# Patient Record
Sex: Female | Born: 1945
Health system: Southern US, Community
[De-identification: ages and names within clinical notes are randomized; demographics above are authoritative.]

## PROBLEM LIST (undated history)

## (undated) DIAGNOSIS — C50912 Malignant neoplasm of unspecified site of left female breast: Secondary | ICD-10-CM

## (undated) DIAGNOSIS — C50919 Malignant neoplasm of unspecified site of unspecified female breast: Secondary | ICD-10-CM

## (undated) HISTORY — DX: Malignant neoplasm of unspecified site of left female breast: C50.912

## (undated) HISTORY — PX: TUBAL LIGATION: SHX77

## (undated) HISTORY — DX: Malignant neoplasm of unspecified site of unspecified female breast: C50.919

## (undated) HISTORY — PX: TONSILLECTOMY: SUR1361

---

## 2003-08-23 HISTORY — PX: MASTECTOMY, PARTIAL: SHX709

## 2003-08-23 HISTORY — PX: MASTECTOMY W/ NODES PARTIAL: SUR854

## 2003-08-26 ENCOUNTER — Encounter (INDEPENDENT_AMBULATORY_CARE_PROVIDER_SITE_OTHER): Payer: Self-pay | Admitting: Diagnostic Radiology

## 2003-08-26 ENCOUNTER — Encounter: Admission: RE | Admit: 2003-08-26 | Discharge: 2003-08-26 | Payer: Self-pay | Admitting: Family Medicine

## 2003-08-26 ENCOUNTER — Encounter (INDEPENDENT_AMBULATORY_CARE_PROVIDER_SITE_OTHER): Payer: Self-pay | Admitting: *Deleted

## 2003-09-21 ENCOUNTER — Inpatient Hospital Stay (HOSPITAL_COMMUNITY): Admission: RE | Admit: 2003-09-21 | Discharge: 2003-09-23 | Payer: Self-pay | Admitting: General Surgery

## 2003-10-25 ENCOUNTER — Encounter: Admission: RE | Admit: 2003-10-25 | Discharge: 2003-10-25 | Payer: Self-pay | Admitting: Oncology

## 2003-10-25 ENCOUNTER — Encounter (HOSPITAL_COMMUNITY): Admission: RE | Admit: 2003-10-25 | Discharge: 2003-11-24 | Payer: Self-pay | Admitting: Oncology

## 2003-10-26 ENCOUNTER — Ambulatory Visit (HOSPITAL_COMMUNITY): Admission: RE | Admit: 2003-10-26 | Discharge: 2003-10-26 | Payer: Self-pay | Admitting: Oncology

## 2003-11-11 ENCOUNTER — Ambulatory Visit (HOSPITAL_COMMUNITY): Admission: RE | Admit: 2003-11-11 | Discharge: 2003-11-11 | Payer: Self-pay | Admitting: Oncology

## 2003-11-25 ENCOUNTER — Encounter: Admission: RE | Admit: 2003-11-25 | Discharge: 2003-11-25 | Payer: Self-pay | Admitting: Oncology

## 2003-11-25 ENCOUNTER — Encounter (HOSPITAL_COMMUNITY): Admission: RE | Admit: 2003-11-25 | Discharge: 2003-12-25 | Payer: Self-pay | Admitting: Oncology

## 2003-12-29 ENCOUNTER — Encounter (HOSPITAL_COMMUNITY): Admission: RE | Admit: 2003-12-29 | Discharge: 2004-01-28 | Payer: Self-pay | Admitting: Oncology

## 2003-12-29 ENCOUNTER — Encounter: Admission: RE | Admit: 2003-12-29 | Discharge: 2003-12-29 | Payer: Self-pay | Admitting: Oncology

## 2004-01-04 ENCOUNTER — Ambulatory Visit: Admission: RE | Admit: 2004-01-04 | Discharge: 2004-04-03 | Payer: Self-pay | Admitting: Radiation Oncology

## 2004-04-04 ENCOUNTER — Encounter (HOSPITAL_COMMUNITY): Admission: RE | Admit: 2004-04-04 | Discharge: 2004-05-04 | Payer: Self-pay | Admitting: Oncology

## 2004-04-04 ENCOUNTER — Encounter: Admission: RE | Admit: 2004-04-04 | Discharge: 2004-04-04 | Payer: Self-pay | Admitting: Oncology

## 2004-07-19 ENCOUNTER — Encounter (HOSPITAL_COMMUNITY): Admission: RE | Admit: 2004-07-19 | Discharge: 2004-07-21 | Payer: Self-pay | Admitting: Oncology

## 2004-07-19 ENCOUNTER — Encounter: Admission: RE | Admit: 2004-07-19 | Discharge: 2004-07-21 | Payer: Self-pay | Admitting: Oncology

## 2005-01-16 ENCOUNTER — Ambulatory Visit (HOSPITAL_COMMUNITY): Payer: Self-pay | Admitting: Oncology

## 2005-01-16 ENCOUNTER — Encounter: Admission: RE | Admit: 2005-01-16 | Discharge: 2005-01-16 | Payer: Self-pay | Admitting: Oncology

## 2005-01-16 ENCOUNTER — Encounter (HOSPITAL_COMMUNITY): Admission: RE | Admit: 2005-01-16 | Discharge: 2005-02-15 | Payer: Self-pay | Admitting: Oncology

## 2005-03-07 ENCOUNTER — Encounter (HOSPITAL_COMMUNITY): Admission: RE | Admit: 2005-03-07 | Discharge: 2005-04-06 | Payer: Self-pay | Admitting: Oncology

## 2005-03-07 ENCOUNTER — Encounter: Admission: RE | Admit: 2005-03-07 | Discharge: 2005-03-07 | Payer: Self-pay | Admitting: Oncology

## 2005-07-16 ENCOUNTER — Encounter (HOSPITAL_COMMUNITY): Admission: RE | Admit: 2005-07-16 | Discharge: 2005-07-21 | Payer: Self-pay | Admitting: Oncology

## 2005-07-16 ENCOUNTER — Encounter: Admission: RE | Admit: 2005-07-16 | Discharge: 2005-07-21 | Payer: Self-pay | Admitting: Oncology

## 2005-07-16 ENCOUNTER — Ambulatory Visit (HOSPITAL_COMMUNITY): Payer: Self-pay | Admitting: Oncology

## 2005-10-09 ENCOUNTER — Encounter: Admission: RE | Admit: 2005-10-09 | Discharge: 2005-10-09 | Payer: Self-pay | Admitting: Oncology

## 2005-10-09 ENCOUNTER — Ambulatory Visit (HOSPITAL_COMMUNITY): Payer: Self-pay | Admitting: Oncology

## 2006-04-30 ENCOUNTER — Ambulatory Visit (HOSPITAL_COMMUNITY): Payer: Self-pay | Admitting: Oncology

## 2006-04-30 ENCOUNTER — Encounter: Admission: RE | Admit: 2006-04-30 | Discharge: 2006-04-30 | Payer: Self-pay | Admitting: Oncology

## 2006-04-30 ENCOUNTER — Encounter (HOSPITAL_COMMUNITY): Admission: RE | Admit: 2006-04-30 | Discharge: 2006-05-30 | Payer: Self-pay | Admitting: Oncology

## 2006-07-31 ENCOUNTER — Encounter (HOSPITAL_COMMUNITY): Admission: RE | Admit: 2006-07-31 | Discharge: 2006-08-30 | Payer: Self-pay | Admitting: Oncology

## 2006-07-31 ENCOUNTER — Encounter: Admission: RE | Admit: 2006-07-31 | Discharge: 2006-07-31 | Payer: Self-pay | Admitting: Oncology

## 2006-07-31 ENCOUNTER — Ambulatory Visit (HOSPITAL_COMMUNITY): Payer: Self-pay | Admitting: Oncology

## 2007-01-07 ENCOUNTER — Ambulatory Visit (HOSPITAL_COMMUNITY): Payer: Self-pay | Admitting: Oncology

## 2007-01-07 ENCOUNTER — Encounter (HOSPITAL_COMMUNITY): Admission: RE | Admit: 2007-01-07 | Discharge: 2007-02-06 | Payer: Self-pay | Admitting: Oncology

## 2007-05-29 ENCOUNTER — Ambulatory Visit: Payer: Self-pay | Admitting: Internal Medicine

## 2007-06-19 ENCOUNTER — Ambulatory Visit (HOSPITAL_COMMUNITY): Admission: RE | Admit: 2007-06-19 | Discharge: 2007-06-19 | Payer: Self-pay | Admitting: Internal Medicine

## 2007-06-19 ENCOUNTER — Encounter: Payer: Self-pay | Admitting: Internal Medicine

## 2007-06-19 ENCOUNTER — Ambulatory Visit: Payer: Self-pay | Admitting: Internal Medicine

## 2007-07-21 ENCOUNTER — Ambulatory Visit (HOSPITAL_COMMUNITY): Payer: Self-pay | Admitting: Oncology

## 2007-07-23 ENCOUNTER — Ambulatory Visit (HOSPITAL_COMMUNITY): Admission: RE | Admit: 2007-07-23 | Discharge: 2007-07-23 | Payer: Self-pay | Admitting: Family Medicine

## 2007-07-30 ENCOUNTER — Ambulatory Visit (HOSPITAL_COMMUNITY): Admission: RE | Admit: 2007-07-30 | Discharge: 2007-07-30 | Payer: Self-pay | Admitting: Family Medicine

## 2008-02-06 ENCOUNTER — Encounter (HOSPITAL_COMMUNITY): Admission: RE | Admit: 2008-02-06 | Discharge: 2008-03-07 | Payer: Self-pay | Admitting: Oncology

## 2008-02-06 ENCOUNTER — Ambulatory Visit (HOSPITAL_COMMUNITY): Payer: Self-pay | Admitting: Oncology

## 2008-09-30 ENCOUNTER — Ambulatory Visit (HOSPITAL_COMMUNITY): Admission: RE | Admit: 2008-09-30 | Discharge: 2008-09-30 | Payer: Self-pay | Admitting: Family Medicine

## 2009-03-07 ENCOUNTER — Encounter (HOSPITAL_COMMUNITY): Admission: RE | Admit: 2009-03-07 | Discharge: 2009-04-06 | Payer: Self-pay | Admitting: Oncology

## 2009-03-07 ENCOUNTER — Ambulatory Visit (HOSPITAL_COMMUNITY): Payer: Self-pay | Admitting: Oncology

## 2010-03-22 ENCOUNTER — Ambulatory Visit (HOSPITAL_COMMUNITY): Admission: RE | Admit: 2010-03-22 | Discharge: 2010-03-22 | Payer: Self-pay | Admitting: Oncology

## 2010-06-13 ENCOUNTER — Encounter (HOSPITAL_COMMUNITY): Admission: RE | Admit: 2010-06-13 | Discharge: 2010-07-13 | Payer: Self-pay | Admitting: Oncology

## 2010-06-13 ENCOUNTER — Ambulatory Visit (HOSPITAL_COMMUNITY): Payer: Self-pay | Admitting: Oncology

## 2010-10-17 ENCOUNTER — Encounter (INDEPENDENT_AMBULATORY_CARE_PROVIDER_SITE_OTHER): Payer: Self-pay | Admitting: *Deleted

## 2010-11-12 ENCOUNTER — Encounter (HOSPITAL_COMMUNITY): Payer: Self-pay | Admitting: Oncology

## 2010-11-12 ENCOUNTER — Encounter: Payer: Self-pay | Admitting: Family Medicine

## 2010-11-23 NOTE — Letter (Signed)
Summary: Out of Work Note  [facility]  [address]   [place], [place] [address]   Phone: [phone]  Fax: [phone]    10/17/2010  TO: Katherine Mcmillan IT MAY CONCERN  RE: Absence from work during husband's medical procedure  Katherine Mcmillan [place] [address] [place] 1946/02/25       The above named individual's husband was currently under my care and she would have needed to be out of work to perform the duties of a caregiver during the dates below.    FROM:  09/24/2010   THROUGH: 09/26/10    REASON:  Due to medical procedure of husband who needed 24 hour care for gastrointestinal procedure.    MAY RETURN ON:  09/27/10     If you have any further questions or need additional information, please call.     Sincerely,      R. Roetta Sessions, M.D.   [name], Clinic Site Manager [facility]  Phone: [phone]    Fax: [phone]

## 2011-01-04 LAB — COMPREHENSIVE METABOLIC PANEL
ALT: 17 U/L (ref 0–35)
AST: 21 U/L (ref 0–37)
Albumin: 4.2 g/dL (ref 3.5–5.2)
Alkaline Phosphatase: 49 U/L (ref 39–117)
BUN: 12 mg/dL (ref 6–23)
CO2: 29 mEq/L (ref 19–32)
Calcium: 9 mg/dL (ref 8.4–10.5)
Chloride: 103 mEq/L (ref 96–112)
Creatinine, Ser: 0.77 mg/dL (ref 0.4–1.2)
GFR calc Af Amer: >60 mL/min (ref >60)
GFR calc non Af Amer: >60 mL/min (ref >60)
Glucose, Bld: 84 mg/dL (ref 70–99)
Potassium: 3.7 mEq/L (ref 3.5–5.1)
Sodium: 137 mEq/L (ref 135–145)
Total Bilirubin: 0.6 mg/dL (ref 0.3–1.2)
Total Protein: 7.3 g/dL (ref 6.0–8.3)

## 2011-01-04 LAB — CBC
HCT: 38.9 % (ref 36.0–46.0)
Hemoglobin: 13.2 g/dL (ref 12.0–15.0)
MCH: 29.9 pg (ref 26.0–34.0)
MCHC: 33.9 g/dL (ref 30.0–36.0)
MCV: 88.2 fL (ref 78.0–100.0)
Platelets: 204 10*3/uL (ref 150–400)
RBC: 4.41 MIL/uL (ref 3.87–5.11)
RDW: 14.1 % (ref 11.5–15.5)
WBC: 7.6 10*3/uL (ref 4.0–10.5)

## 2011-01-04 LAB — DIFFERENTIAL
Basophils Absolute: 0 10*3/uL (ref 0.0–0.1)
Basophils Relative: 1 % (ref 0–1)
Eosinophils Absolute: 0.1 10*3/uL (ref 0.0–0.7)
Eosinophils Relative: 2 % (ref 0–5)
Lymphocytes Relative: 36 % (ref 12–46)
Lymphs Abs: 2.7 10*3/uL (ref 0.7–4.0)
Monocytes Absolute: 0.7 10*3/uL (ref 0.1–1.0)
Monocytes Relative: 9 % (ref 3–12)
Neutro Abs: 4 10*3/uL (ref 1.7–7.7)
Neutrophils Relative %: 52 % (ref 43–77)

## 2011-01-30 LAB — COMPREHENSIVE METABOLIC PANEL
ALT: 18 U/L (ref 0–35)
AST: 21 U/L (ref 0–37)
Albumin: 3.9 g/dL (ref 3.5–5.2)
Alkaline Phosphatase: 57 U/L (ref 39–117)
BUN: 10 mg/dL (ref 6–23)
CO2: 29 mEq/L (ref 19–32)
Calcium: 8.8 mg/dL (ref 8.4–10.5)
Chloride: 103 mEq/L (ref 96–112)
Creatinine, Ser: 0.71 mg/dL (ref 0.4–1.2)
GFR calc Af Amer: >60 mL/min (ref >60)
GFR calc non Af Amer: >60 mL/min (ref >60)
Glucose, Bld: 88 mg/dL (ref 70–99)
Potassium: 3.9 mEq/L (ref 3.5–5.1)
Sodium: 138 mEq/L (ref 135–145)
Total Bilirubin: 0.9 mg/dL (ref 0.3–1.2)
Total Protein: 7 g/dL (ref 6.0–8.3)

## 2011-01-30 LAB — DIFFERENTIAL
Eosinophils Absolute: 0.1 10*3/uL (ref 0.0–0.7)
Eosinophils Relative: 2 % (ref 0–5)
Lymphocytes Relative: 38 % (ref 12–46)
Lymphs Abs: 1.9 10*3/uL (ref 0.7–4.0)
Monocytes Relative: 11 % (ref 3–12)

## 2011-01-30 LAB — CBC
HCT: 39.9 % (ref 36.0–46.0)
Hemoglobin: 13.7 g/dL (ref 12.0–15.0)
MCHC: 34.5 g/dL (ref 30.0–36.0)
MCV: 90.1 fL (ref 78.0–100.0)
Platelets: 197 10*3/uL (ref 150–400)
RBC: 4.43 MIL/uL (ref 3.87–5.11)
RDW: 13.7 % (ref 11.5–15.5)
WBC: 5.2 10*3/uL (ref 4.0–10.5)

## 2011-03-06 NOTE — Op Note (Signed)
Katherine Mcmillan, Katherine Mcmillan                  ACCOUNT NO.:  [id number]   MEDICAL RECORD NO.:  [id number]          PATIENT TYPE:  AMB   LOCATION:  DAY                           FACILITY:  [facility]   PHYSICIAN:  R. Roetta Sessions, M.D. DATE OF BIRTH:  April 24, 1946   DATE OF PROCEDURE:  06/19/2007  DATE OF DISCHARGE:                               OPERATIVE REPORT   PROCEDURE:  Colonoscopy with biopsy.   INDICATIONS FOR PROCEDURE:  A 65 year old lady sent over out of the  courtesy Dr. Patrica Duel.  She has history of gastroesophageal reflux  disease.  Symptoms well-controlled on omeprazole.  We note she has never  had her lower GI tract imaged and have offered her screening  colonoscopy.  She has no lower GI site tract symptoms and no family  history colorectal neoplasia.  Colonoscopy is now being done.  This  approach has been discussed patient at length.  Potential risks,  benefits, alternatives have been reviewed, questions answered.  She is  agreeable.  Please see documentation medical record.   PROCEDURE NOTE:  O2 saturation, blood pressure, pulse, respirations  monitored throughout entire procedure.   CONSCIOUS SEDATION:  Versed 3 mg IV, Demerol 75 mg IV divided doses.   INSTRUMENT:  Pentax video chip system.   FINDINGS:  Digital rectal exam revealed no abnormalities.   ENDOSCOPIC FINDINGS:  The prep was good.  Colon:  Colonic mucosa was surveyed from the rectosigmoid junction  through the left transverse right colon to area appendiceal orifice,  ileocecal valve and cecum.  These structures well seen photographed for  the record.  From this level scope slowly withdrawn.  All previous  mentioned mucosal surfaces were again seen.  The patient was noted have  a single 4 mm polyp base cecum, cold biopsy/removed.  The patient had  extensive left-sided diverticula, remaining colonic mucosa appeared  normal.  The scope was pulled down the rectum.  Thorough examination  rectal mucosa including  retroflexed view anal verge demonstrated no  abnormalities.  The patient tolerated the procedure well, was reacted in  endoscopy.   IMPRESSION:  Normal rectum, left-sided diverticula, diminutive cecal  polyp status post cold biopsy removal. Remainder colon mucosa appeared  normal.   RECOMMENDATIONS:  1. Diverticulosis literature provided [name].  2. Follow-up on path.  3. Further recommendations to follow.      Jonathon Bellows, M.D.  Electronically Signed     RMR/MEDQ  D:  06/19/2007  T:  06/20/2007  Job:  [id number]   cc:   Corrie Mckusick, M.D.  Fax: [phone]

## 2011-03-06 NOTE — Consult Note (Signed)
NAMEELLINGTON, Katherine Mcmillan                  ACCOUNT NO.:  [id number]   MEDICAL RECORD NO.:  [id number]          PATIENT TYPE:  AMB   LOCATION:  DAY                           FACILITY:  [facility]   PHYSICIAN:  R. Roetta Sessions, M.D. DATE OF BIRTH:  1946/01/19   DATE OF CONSULTATION:  DATE OF DISCHARGE:                                 CONSULTATION   CHIEF COMPLAINT:  GERD/in need of screening colonoscopy.   HISTORY OF PRESENT ILLNESS:  The patient is a 65 year old female who has  never had a screening colonoscopy. She developed severe epigastric and  left upper quadrant pain about 2 weeks ago. She describes heartburn and  indigestion along with some nausea. She was started on Omeprazole 20 mg  b.i.d. by Melony Overly, PA-C. She tells me her symptoms are 100%  relieved at this time. She denies any vomiting, denies any dysphagia,  odynophagia. Denies any anorexia or early satiety. Denies any water  brash. She is generally having 2-3 bowel movements a day. This is her  baseline. She denies any rectal bleeding or melena. Denies any  constipation or diarrhea. Her weight is steadily decreasing due to diet.  She has never had a screening colonoscopy.   PAST MEDICAL AND SURGICAL HISTORY:  She was diagnosed with breast cancer  November of 2004. She is status post lumpectomy, chemotherapy and  radiation. She is status post tubal ligation, status post tonsillectomy.  She is status post H. pylori treatment approximately 10 years ago. She  has history of intermittent GERD over the years.   CURRENT MEDICATIONS:  1. Arimidex once daily.  2. Omeprazole 20 mg b.i.d.  3. Multivitamin daily.   ALLERGIES:  NO KNOWN DRUG ALLERGIES.   FAMILY HISTORY:  There is no known family history of colorectal  carcinoma, carcinoma of the liver, chronic GI problems.   SOCIAL HISTORY:  The patient is married. She has 4 grown healthy  children. She is employed with [employer] full time. She denies any tobacco  or alcohol or drug  use.   REVIEW OF SYSTEMS:  See HPI; otherwise negative.   PHYSICAL EXAMINATION:  VITAL SIGNS: Weight 206 pounds, height 68 inches,  temp 98 degrees, blood pressure 130/74 and pulse 64.  GENERAL: The patient is a well-developed, well-nourished African-  American female in no acute distress.  HEENT: Pupils equal. Conjunctivae not injected. Oropharynx pink and  moist without oral lesions.  NECK: The neck is supple without mass or thyromegaly.  CHEST: Regular rate and rhythm. Normal S1, S2. No murmurs, rubs or  gallops.  LUNGS: Clear to auscultation bilaterally.  ABDOMEN: Positive bowel sounds x4. No bruits auscultated. Soft,  nontender, nondistended without palpable masses or hepatosplenomegaly.  No rebound or guarding.  EXTREMITIES: Without clubbing or edema bilaterally.  SKIN: Warm and dry without any rash or jaundice.   IMPRESSION:  The patient is a 65 year old female with heartburn and  indigestion; now well controlled on b.i.d. proton pump inhibitor. I have  asked her to decrease her omeprazole to once daily to see how she does.  If she does well no further  intervention is necessary at this point.   She is in need of screening colonoscopy at this time.   PLAN:  1. Decrease omeprazole to 20 mg daily. Call if she has any problems.  2. GERD literature given for her review.  3. Screening colonoscopy with Dr. Jena Gauss in the near future. I have      discussed the procedure including the risks and benefits to      include, but are not limited to: Bleeding, infection, perforation,      drug reaction. She agreed with the plan. Exam will be obtained.   We would like to thank Dr. Nobie Putnam for allowing [place] to participate in  the care of this patient.      Lorenza Burton, N.P.      Jonathon Bellows, M.D.  Electronically Signed    KJ/MEDQ  D:  05/29/2007  T:  05/29/2007  Job:  [id number]   cc:   Patrica Duel, M.D.  Fax: [phone]

## 2011-03-09 NOTE — H&P (Signed)
Katherine Mcmillan, Katherine Mcmillan                            ACCOUNT NO.:  [id number]   MEDICAL RECORD NO.:  [id number]                   PATIENT TYPE:  AMB   LOCATION:  DAY                                  FACILITY:  [facility]   PHYSICIAN:  Jerolyn Shin C. Katrinka Blazing, M.D.                DATE OF BIRTH:  07-30-1946   DATE OF ADMISSION:  DATE OF DISCHARGE:                                HISTORY & PHYSICAL   HISTORY OF PRESENT ILLNESS:  [age]-year-old female with history of a  mass of her left breast found on routine mammography in September 2004.  She  underwent core needle biopsy of her left breast on 04 November.  The  pathology was invasive lobular carcinoma of the breast.  The patient had  been advised to have operative therapy.  She is scheduled for partial  mastectomy with node dissection on the left.   PAST HISTORY:  The patient has no major medical illnesses.   ALLERGIES:  The patient has seasonal allergies.  She has no drug allergies.   PAST SURGICAL HISTORY:  1. Tubal ligation.  2. Tonsillectomy.   FAMILY HISTORY:  Family history is positive for hypertension, diabetes  mellitus and breast carcinoma.   SOCIAL HISTORY:  The patient is married.  Employed.  She does not drink,  smoke or use drugs.   PHYSICAL EXAMINATION:  .  HEENT:  Unremarkable.  NECK:  Neck is supple.  No JVD or bruits.  CHEST:  Chest clear to auscultation.  No rales, rubs, rhonchi or wheezes.  HEART:  Regular rate and rhythm without murmur, gallop or rub.  BREASTS:  Bi; nodularity with prominent mass in the upper outer quadrant  with fullness in the left axilla.  There is no hematoma or ecchymosis in the  operative site.  ABDOMEN:  Abdomen obese, soft and nontender.  There are no palpable masses.  EXTREMITIES:  No cyanosis, clubbing or edema.  NEUROLOGIC EXAMINATION:  No focal sensory, motor or cerebellar deficits.   IMPRESSION:  1. Invasive lobular carcinoma, left breast.  2. Hypertension.   PLAN:  The patient will  undergo partial mastectomy with left axillary node  dissection.    ___________________________________________                                         Dirk Dress Katrinka Blazing, M.D.   LCS/MEDQ  D:  09/21/2003  T:  09/21/2003  Job:  [id number]

## 2011-03-09 NOTE — Op Note (Signed)
[name], Katherine Mcmillan                            ACCOUNT NO.:  [id number]   MEDICAL RECORD NO.:  [id number]                   PATIENT TYPE:  INP   LOCATION:  A314                                 FACILITY:  [facility]   PHYSICIAN:  Jerolyn Shin C. Katrinka Blazing, M.D.                DATE OF BIRTH:  [birth year]   DATE OF PROCEDURE:  DATE OF DISCHARGE:                                 OPERATIVE REPORT   PREOPERATIVE DIAGNOSIS:  Invasive lobular carcinoma, left breast.   POSTOPERATIVE DIAGNOSIS:  Invasive lobular carcinoma, left breast.   PROCEDURE:  Left partial mastectomy with axillary node dissection.   SURGEON:  Dirk Dress. Katrinka Blazing, M.D.   ANESTHESIA:  General oroendotracheal anesthesia.   DESCRIPTION:  Under general oroendotracheal anesthesia, the patient's left  breast and axilla were prepped and draped in a sterile field.  A liner  incision was made along the lateral border of the pectoralis and extending  down to the outer aspect of the left breast at about the 3 o'clock position.  Skin flaps were developed.  The hard mass was identified.  It was grasped  with an Allis clamp and wide excision around the mass was carried out to  remove all the mass including the plastic-type reaction that appeared to be  surrounding the mass.  This was excised down to the fascia of the pectoralis  and the lateral chest wall.  Once this was done, further dissection was done  in continuity, excising that portion of the tail of the breast that extended  into the axilla.  The dissection was continued up to the inferior margin of  the axillary vein.  Thoracodorsal neurovascular bundle and long thoracic  nerve were preserved.  There were no palpable enlarged nodes except in the  first level near the end of the tail of the breast.  These were actually  quite small and were very soft.  The axillary fat pad was excised.  Vessels  were clipped and divided.  The specimen was marked with suture to orient the  pathologist.  The axilla  was irrigated with saline.  JP drain was placed.  The subcutaneous tissue was closed in two layers with 2-0 Biosyn and the  skin was closed with 4-0 Dexon.  The patient tolerated the procedure well.  The drain was secured with 3-0 nylon.  A dressing was placed.  The patient  was awakened from anesthesia uneventfully, transferred to a bed and taken to  the postanesthetic care unit.      ___________________________________________                                            Dirk Dress. Katrinka Blazing, M.D.   LCS/MEDQ  D:  09/21/2003  T:  09/21/2003  Job:  [id number]

## 2011-03-09 NOTE — Procedures (Signed)
NAMEKYNZEE, DEVINNEY                              ACCOUNT NO.:  [id number]   MEDICAL RECORD NO.:  [id number]                   PATIENT TYPE:   LOCATION:                                       FACILITY:   PHYSICIAN:  Vida Roller, M.D.                DATE OF BIRTH:  1946-06-04   DATE OF PROCEDURE:  11/10/2003  DATE OF DISCHARGE:                                  ECHOCARDIOGRAM   TAPE NUMBER:  [id number].   TAPE COUNT:  [id number].   This is a 65 year old woman with breast cancer, for chemotherapy.  This is  for assessment for LV function.   Technical quality of the study is good.   M-MODE MEASUREMENTS:  The aorta is 29 mm.   The left atrium is 39 mm.   The septum is 13 mm.   The posterior wall is 11 mm.   Left ventricular diastolic dimension is 45 mm.   Left ventricular systolic dimension is 27 mm.   TWO-DIMENSIONAL AND DOPPLER IMAGING:  The left ventricle is normal size,  with normal systolic and diastolic function.  Estimated ejection fraction 60-  65%.  There are no wall motion abnormalities seen.  The diastolic function  appears to be normal.   The right ventricle is normal size with normal systolic function.   Both atria appear to be normal size.  There is no obvious atrial septal  defect.   The aortic valve is trileaflet, __________ with no evidence of stenosis or  regurgitation.   The mitral valve is morphologically unremarkable, with no stenosis or  regurgitation.   The tricuspid valve is morphologically unremarkable with trivial  insufficiency.  No stenosis is seen.   Pulmonic valve is not well seen.  Appears to have trivial insufficiency.  No  stenosis is seen.   Pericardial structures appear normal.   The inferior vena cava appears to be normal size.   The ascending aorta was not well seen.      ___________________________________________                                            Vida Roller, M.D.   JH/MEDQ  D:  11/10/2003  T:  11/11/2003  Job:   [id number]

## 2011-03-09 NOTE — Discharge Summary (Signed)
NAMEBARB, SHEAR                            ACCOUNT NO.:  [id number]   MEDICAL RECORD NO.:  [id number]                   PATIENT TYPE:  INP   LOCATION:  A314                                 FACILITY:  [facility]   PHYSICIAN:  Jerolyn Shin C. Katrinka Blazing, M.D.                DATE OF BIRTH:  04/02/1946   DATE OF ADMISSION:  09/21/2003  DATE OF DISCHARGE:  09/23/2003                                 DISCHARGE SUMMARY   DISCHARGE DIAGNOSIS:  Invasive lobular carcinoma of left breast.   PROCEDURE:  Left partial mastectomy with axillary node dissection November  30.   DISPOSITION:  The patient is discharged home in stable and satisfactory  condition.   DISCHARGE MEDICATION:  Tylox one every four hours as needed for pain.   FOLLOW UP:  The patient was to be seen in the office in two weeks. She will  have home health visits every other day for wound care.   SUMMARY:  A 65 year old female with a history of mass of the left breast  found on routine mammography in September 2004. She underwent core biopsy of  the left breast on September 4. Pathology with invasive lobular carcinoma of  the breast. She was advised to have operative therapy. She elected to have  partial mastectomy with node dissection. The patient was admitted and  underwent surgery on November 30. She had an uneventful postoperative course  and was discharged on the second postoperative day in satisfactory  condition.     ___________________________________________                                         Dirk Dress Katrinka Blazing, M.D.   LCS/MEDQ  D:  10/24/2003  T:  10/25/2003  Job:  [id number]

## 2011-06-13 ENCOUNTER — Encounter (HOSPITAL_COMMUNITY): Payer: Self-pay | Admitting: Oncology

## 2011-06-13 ENCOUNTER — Encounter (HOSPITAL_COMMUNITY): Payer: 59 | Attending: Oncology | Admitting: Oncology

## 2011-06-13 VITALS — BP 147/76 | HR 78 | Temp 98.5°F | Wt 207.8 lb

## 2011-06-13 DIAGNOSIS — C50919 Malignant neoplasm of unspecified site of unspecified female breast: Secondary | ICD-10-CM

## 2011-06-13 DIAGNOSIS — Z170 Estrogen receptor positive status [ER+]: Secondary | ICD-10-CM

## 2011-06-13 DIAGNOSIS — E669 Obesity, unspecified: Secondary | ICD-10-CM

## 2011-06-13 DIAGNOSIS — Z09 Encounter for follow-up examination after completed treatment for conditions other than malignant neoplasm: Secondary | ICD-10-CM | POA: Insufficient documentation

## 2011-06-13 DIAGNOSIS — Z853 Personal history of malignant neoplasm of breast: Secondary | ICD-10-CM | POA: Insufficient documentation

## 2011-06-13 LAB — DIFFERENTIAL
Basophils Absolute: 0 10*3/uL (ref 0.0–0.1)
Eosinophils Absolute: 0.2 10*3/uL (ref 0.0–0.7)
Eosinophils Relative: 3 % (ref 0–5)
Lymphocytes Relative: 35 % (ref 12–46)
Lymphs Abs: 2.1 10*3/uL (ref 0.7–4.0)
Monocytes Absolute: 0.7 10*3/uL (ref 0.1–1.0)

## 2011-06-13 LAB — COMPREHENSIVE METABOLIC PANEL
ALT: 18 U/L (ref 0–35)
CO2: 29 mEq/L (ref 19–32)
Calcium: 10.3 mg/dL (ref 8.4–10.5)
Creatinine, Ser: 0.7 mg/dL (ref 0.50–1.10)
GFR calc Af Amer: >60 mL/min (ref >60)
GFR calc non Af Amer: >60 mL/min (ref >60)
Glucose, Bld: 89 mg/dL (ref 70–99)
Sodium: 137 mEq/L (ref 135–145)
Total Protein: 6.9 g/dL (ref 6.0–8.3)

## 2011-06-13 LAB — CBC
HCT: 40.2 % (ref 36.0–46.0)
MCH: 29.6 pg (ref 26.0–34.0)
MCV: 88.7 fL (ref 78.0–100.0)
Platelets: 210 10*3/uL (ref 150–400)
RDW: 13.8 % (ref 11.5–15.5)

## 2011-06-13 NOTE — Patient Instructions (Signed)
[facility]  Discharge Instructions  RECOMMENDATIONS MADE BY THE CONSULTANT AND ANY TEST RESULTS WILL BE SENT TO YOUR REFERRING DOCTOR.   EXAM FINDINGS BY MD TODAY AND SIGNS AND SYMPTOMS TO REPORT TO CLINIC OR PRIMARY MD: Need mammogram soon. Report any lumps, unusual bone pain or shortness of breath.  MEDICATIONS PRESCRIBED: Try over the counter Prilosec daily.  If no improvement in symptoms, see Dr. Jena Gauss      SPECIAL INSTRUCTIONS/FOLLOW-UP: Return to see MD in 1 year.  I acknowledge that I have been informed and understand all the instructions given to me and received a copy. I do not have any more questions at this time, but understand that I may call the Specialty Clinic at [facility] at [phone] during business hours should I have any further questions or need assistance in obtaining follow-up care.    __________________________________________  _____________  __________ Signature of Patient or Authorized Representative            Date                   Time    __________________________________________ Nurse's Signature

## 2011-06-13 NOTE — Progress Notes (Signed)
This office note has been dictated.

## 2011-07-04 ENCOUNTER — Ambulatory Visit (HOSPITAL_COMMUNITY): Admission: RE | Admit: 2011-07-04 | Payer: 59 | Source: Ambulatory Visit

## 2011-07-06 ENCOUNTER — Ambulatory Visit (HOSPITAL_COMMUNITY): Payer: 59

## 2011-07-09 ENCOUNTER — Ambulatory Visit (HOSPITAL_COMMUNITY)
Admission: RE | Admit: 2011-07-09 | Discharge: 2011-07-09 | Disposition: A | Discharge status: Home / Self Care | Payer: 59 | Source: Ambulatory Visit | Attending: Oncology | Admitting: Oncology

## 2011-07-09 DIAGNOSIS — C50919 Malignant neoplasm of unspecified site of unspecified female breast: Secondary | ICD-10-CM

## 2011-07-09 DIAGNOSIS — Z1231 Encounter for screening mammogram for malignant neoplasm of breast: Secondary | ICD-10-CM | POA: Insufficient documentation

## 2011-07-17 LAB — COMPREHENSIVE METABOLIC PANEL
ALT: 19
Alkaline Phosphatase: 75
BUN: 9
CO2: 30
Chloride: 102
GFR calc non Af Amer: >60
Glucose, Bld: 80
Potassium: 4.1
Sodium: 137
Total Bilirubin: 0.5
Total Protein: 6.5

## 2011-07-17 LAB — CBC
HCT: 38.1
Hemoglobin: 13.4
RBC: 4.39

## 2011-07-17 LAB — DIFFERENTIAL
Basophils Absolute: 0
Basophils Relative: 1
Eosinophils Absolute: 0.2
Monocytes Relative: 11
Neutro Abs: 3.5
Neutrophils Relative %: 50

## 2011-08-03 NOTE — Progress Notes (Signed)
CC:   Katherine Bonds, RN C/O regional cancer protocol office Corrie Mckusick, M.D. Billie Lade, Ph.D., M.D.  DIAGNOSES: 1. Stage II (T2 N0 M0) infiltrating ductal carcinoma of the left     breast status post lumpectomy and axillary node dissection.  She     had 7 negative nodes.  She was ER positive 70%, PR positive 91%,     HER-2/neu 0, Ki-67 marker low at 6% and was well differentiated and     grade 1.  Date of surgery was 09/21/2003 and she participated in     CALGB-40101 and was randomized to [facility] x4 cycles completed as of     12/23/2003.  She then was treated with radiation therapy to the     left breast followed by Arimidex for 5 years finishing that as of     mid April 2010. 2. Obesity still weighing 207 pounds on a 5 feet 7 inch frame. 3. Gastroesophageal reflux disease intermittently in the past and it     sounds like she was treated with a combination pack for H pylori in     the distant past she states.  Analys is here today.  She has done well over the year but is 2 months overdue for her mammograms.  We will schedule those for her.  Her labs are pending from today as well.  REVIEW OF SYSTEMS:  She is not aware of any lumps anywhere.  No new pains anywhere.  No shortness of breath, no chest pain.  She is still working full-time.  No night sweats.  Bowels are working well.  No blood in her stools.  No blood in her urine but she does describe some epigastric discomfort which feels like indigestion she states.  It is usually after she eats but sometimes it is long after she eats.  She does not take anything for this.  It is basically limited to a multivitamin, calcium, vitamin D.  Her review of systems oncologically otherwise is negative.  PHYSICAL EXAMINATION:  Vital signs:  Still very stable.  Nothing really has changed.  Lymph nodes:  Negative throughout.  She has no thyromegaly.  Breasts:  Negative bilaterally for any masses, just some postsurgical thickening and  changes on the left breast are noted. Lungs:  Clear to auscultation and percussion.  Heart:  Shows a regular rhythm and rate without murmur, rub or gallop.  Abdomen:  Soft and nontender that organomegaly or masses.  Bowel sounds are actually very normal.  She has no peripheral edema.  She thinks she is up-to-date on her colonoscopy.  She saw me 8 years ago and still remains disease free, so I have asked her to think about checking with Dr. Luvenia Starch office about when she is due for colonoscopy.  I have also suggested to her that she take Prilosec 20 mg a day and if she is not better in a month to let us make an appointment with Dr. Jena Gauss.  We will otherwise see her in a year, sooner if need.  She continues to work full-time as I mentioned and really looks good overall.    ______________________________ Ladona Horns. Mariel Sleet, MD ESN/MEDQ  D:  06/13/2011  T:  06/13/2011  Job:  [id number]

## 2011-08-31 ENCOUNTER — Other Ambulatory Visit (HOSPITAL_COMMUNITY): Payer: Self-pay | Admitting: Family Medicine

## 2011-08-31 DIAGNOSIS — Z01419 Encounter for gynecological examination (general) (routine) without abnormal findings: Secondary | ICD-10-CM

## 2011-08-31 DIAGNOSIS — Z139 Encounter for screening, unspecified: Secondary | ICD-10-CM

## 2011-09-06 ENCOUNTER — Ambulatory Visit (HOSPITAL_COMMUNITY)
Admission: RE | Admit: 2011-09-06 | Discharge: 2011-09-06 | Disposition: A | Discharge status: Home / Self Care | Payer: 59 | Source: Ambulatory Visit | Attending: Family Medicine | Admitting: Family Medicine

## 2011-09-06 DIAGNOSIS — Z780 Asymptomatic menopausal state: Secondary | ICD-10-CM | POA: Insufficient documentation

## 2011-09-06 DIAGNOSIS — Z01419 Encounter for gynecological examination (general) (routine) without abnormal findings: Secondary | ICD-10-CM

## 2011-09-06 DIAGNOSIS — Z139 Encounter for screening, unspecified: Secondary | ICD-10-CM

## 2012-06-11 ENCOUNTER — Ambulatory Visit (HOSPITAL_COMMUNITY): Payer: Self-pay | Admitting: Oncology

## 2012-06-12 ENCOUNTER — Other Ambulatory Visit (HOSPITAL_COMMUNITY): Payer: Self-pay | Admitting: Family Medicine

## 2012-06-12 DIAGNOSIS — Z139 Encounter for screening, unspecified: Secondary | ICD-10-CM

## 2012-07-01 ENCOUNTER — Encounter: Payer: Self-pay | Admitting: *Deleted

## 2012-07-08 ENCOUNTER — Encounter (HOSPITAL_COMMUNITY): Payer: PRIVATE HEALTH INSURANCE | Attending: Oncology | Admitting: Oncology

## 2012-07-08 VITALS — BP 176/66 | HR 71 | Temp 97.9°F | Resp 18 | Ht 68.0 in | Wt 213.0 lb

## 2012-07-08 DIAGNOSIS — Z853 Personal history of malignant neoplasm of breast: Secondary | ICD-10-CM

## 2012-07-08 DIAGNOSIS — C50912 Malignant neoplasm of unspecified site of left female breast: Secondary | ICD-10-CM

## 2012-07-08 NOTE — Progress Notes (Signed)
Problem #1 stage II (T2, N0, M0) infiltrating ductal carcinoma left breast status post lumpectomy and axillary node dissection. She had 7 negative lymph nodes. Estrogen receptors 70% positive, progesterone receptors 91% positive HER-2/neu 0, Ki-67 marker low 6% and this was a grade 1 cancer. Her date of surgery was 09/21/2003 and she was treated on CALGB-40101 protocol and received Adriamycin and Cytoxan x4 cycles completed as of 12/23/2003. She was then treated with radiation therapy to the left breast followed by 5 years of Arimidex and she finished that as of mid April 2010. Problem #2 obesity still weighing over 200 pounds. Problem #3 GERD with H. pylori in the past  The patient retired in January of this year. 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 She essentially is working as much as she did in the past. She has no help with her mother. Her own review of systems is negative. Vital signs are recorded. She has no lymphadenopathy. Both breasts show no masses. Left breast is slightly smaller but well healed from the surgery and the radiation. She has clear lung fields. Heart shows a regular rhythm and rate without murmur rub or gallop. Her abdomen is soft nontender normal bowel sounds. There is no organomegaly. She has no peripheral edema the arms or legs. She has no thyromegaly.  She does not want blood work today. She will come back to see me in a year. We will release her after 15 years though she has told me she will stop coming here when I retire.

## 2012-07-08 NOTE — Patient Instructions (Addendum)
[facility]  Discharge Instructions ORENDA NIE  DOB [birth year] CSN [id number]  MRN [id number] Dr. Glenford Peers RECOMMENDATIONS MADE BY THE CONSULTANT AND ANY TEST RESULTS WILL BE SENT TO YOUR REFERRING DOCTOR.   EXAM FINDINGS BY MD TODAY AND SIGNS AND SYMPTOMS TO REPORT TO CLINIC OR PRIMARY MD:  Exam good.  INSTRUCTIONS GIVEN AND DISCUSSED: We need to see you yearly for about 15 years  SPECIAL INSTRUCTIONS/FOLLOW-UP: Report any new lumps or bumps I acknowledge that I have been informed and understand all the instructions given to me and received a copy. I do not have any more questions at this time, but understand that I may call the Specialty Clinic at [facility] at [phone] during business hours should I have any further questions or need assistance in obtaining follow-up care.    __________________________________________  _____________  __________ Signature of Patient or Authorized Representative            Date                   Time    __________________________________________ Nurse's Signature

## 2012-07-10 ENCOUNTER — Ambulatory Visit (HOSPITAL_COMMUNITY)
Admission: RE | Admit: 2012-07-10 | Discharge: 2012-07-10 | Disposition: A | Discharge status: Home / Self Care | Payer: PRIVATE HEALTH INSURANCE | Source: Ambulatory Visit | Attending: Family Medicine | Admitting: Family Medicine

## 2012-07-10 DIAGNOSIS — Z1231 Encounter for screening mammogram for malignant neoplasm of breast: Secondary | ICD-10-CM | POA: Insufficient documentation

## 2012-07-10 DIAGNOSIS — Z139 Encounter for screening, unspecified: Secondary | ICD-10-CM

## 2012-10-22 DIAGNOSIS — Z923 Personal history of irradiation: Secondary | ICD-10-CM

## 2012-10-22 DIAGNOSIS — C50919 Malignant neoplasm of unspecified site of unspecified female breast: Secondary | ICD-10-CM

## 2012-10-22 DIAGNOSIS — Z9221 Personal history of antineoplastic chemotherapy: Secondary | ICD-10-CM

## 2012-10-22 HISTORY — DX: Personal history of irradiation: Z92.3

## 2012-10-22 HISTORY — DX: Personal history of antineoplastic chemotherapy: Z92.21

## 2012-10-22 HISTORY — DX: Malignant neoplasm of unspecified site of unspecified female breast: C50.919

## 2013-07-07 ENCOUNTER — Ambulatory Visit (HOSPITAL_COMMUNITY): Payer: Self-pay

## 2013-07-18 DIAGNOSIS — C50912 Malignant neoplasm of unspecified site of left female breast: Secondary | ICD-10-CM | POA: Insufficient documentation

## 2013-07-18 HISTORY — DX: Malignant neoplasm of unspecified site of left female breast: C50.912

## 2013-07-28 ENCOUNTER — Encounter (HOSPITAL_COMMUNITY): Payer: Medicare Other | Attending: Hematology and Oncology

## 2013-07-28 ENCOUNTER — Encounter (HOSPITAL_COMMUNITY): Payer: Self-pay

## 2013-07-28 VITALS — BP 144/75 | HR 82 | Temp 97.4°F | Resp 16 | Wt 212.0 lb

## 2013-07-28 DIAGNOSIS — Z853 Personal history of malignant neoplasm of breast: Secondary | ICD-10-CM | POA: Insufficient documentation

## 2013-07-28 DIAGNOSIS — C50919 Malignant neoplasm of unspecified site of unspecified female breast: Secondary | ICD-10-CM

## 2013-07-28 DIAGNOSIS — Z09 Encounter for follow-up examination after completed treatment for conditions other than malignant neoplasm: Secondary | ICD-10-CM | POA: Insufficient documentation

## 2013-07-28 DIAGNOSIS — K219 Gastro-esophageal reflux disease without esophagitis: Secondary | ICD-10-CM

## 2013-07-28 LAB — CBC WITH DIFFERENTIAL/PLATELET
Basophils Relative: 1 % (ref 0–1)
Eosinophils Absolute: 0.2 10*3/uL (ref 0.0–0.7)
Lymphs Abs: 2.2 10*3/uL (ref 0.7–4.0)
MCH: 29.7 pg (ref 26.0–34.0)
Neutro Abs: 3 10*3/uL (ref 1.7–7.7)
Neutrophils Relative %: 49 % (ref 43–77)
Platelets: 259 10*3/uL (ref 150–400)
RBC: 4.75 MIL/uL (ref 3.87–5.11)

## 2013-07-28 LAB — COMPREHENSIVE METABOLIC PANEL
ALT: 13 U/L (ref 0–35)
Albumin: 3.8 g/dL (ref 3.5–5.2)
Alkaline Phosphatase: 57 U/L (ref 39–117)
Chloride: 103 mEq/L (ref 96–112)
Glucose, Bld: 96 mg/dL (ref 70–99)
Potassium: 4.1 mEq/L (ref 3.5–5.1)
Sodium: 141 mEq/L (ref 135–145)
Total Protein: 7.2 g/dL (ref 6.0–8.3)

## 2013-07-28 NOTE — Patient Instructions (Addendum)
[facility] Discharge Instructions  RECOMMENDATIONS MADE BY THE CONSULTANT AND ANY TEST RESULTS WILL BE SENT TO YOUR REFERRING PHYSICIAN.  EXAM FINDINGS BY THE PHYSICIAN TODAY AND SIGNS OR SYMPTOMS TO REPORT TO CLINIC OR PRIMARY PHYSICIAN: Exam and findings as discussed by Dr. Zigmund Daniel.  You are doing well.  No evidence of recurrence by exam.  Will check some blood work today and if anything is abnormal we will contact you. Report any new lumps, bone pain, shortness of breath or other symptoms.  MEDICATIONS PRESCRIBED:  none  INSTRUCTIONS/FOLLOW-UP: Follow-up in 1 year.  Thank you for choosing [facility] to provide your oncology and hematology care.  To afford each patient quality time with our providers, please arrive at least 15 minutes before your scheduled appointment time.  With your help, our goal is to use those 15 minutes to complete the necessary work-up to ensure our physicians have the information they need to help with your evaluation and healthcare recommendations.    Effective January 1st, 2014, we ask that you re-schedule your appointment with our physicians should you arrive 10 or more minutes late for your appointment.  We strive to give you quality time with our providers, and arriving late affects you and other patients whose appointments are after yours.    Again, thank you for choosing [facility]  Our hope is that these requests will decrease the amount of time that you wait before being seen by our physicians.       _____________________________________________________________  Should you have questions after your visit to [facility], please contact our office at [phone] between the hours of 8:30 a.m. and 5:00 p.m.  Voicemails left after 4:30 p.m. will not be returned until the following business day.  For prescription refill requests, have your pharmacy contact our office with your prescription refill  request.

## 2013-07-28 NOTE — Progress Notes (Signed)
[facility] OFFICE PROGRESS NOTE  Colette Ribas, MD [address] [place] [address]  DIAGNOSIS: Breast cancer, unspecified laterality - Plan: CBC with Differential, Comprehensive metabolic panel, CEA, Cancer antigen 27.29, CBC with Differential, Comprehensive metabolic panel, CEA, Cancer antigen 27.29, MM Digital Screening, CBC with Differential, Comprehensive metabolic panel, CEA, Cancer antigen 27.29  Chief Complaint  Patient presents with  . Breast Cancer    CURRENT THERAPY: Watchful expectation.  INTERVAL HISTORY: Katherine Mcmillan 67 y.o. female returns for followup of stage II duct cell carcinoma left breast, currently on no treatment She continues to do well with normal self breast examinations on a monthly basis. She denies any lymphedema. Appetite is good with no nausea, vomiting, diarrhea, constipation, dysuria, hematuria, vaginal bleeding, cough, shortness of breath, bone pain, skin rash, headache, or seizures. She does have chronically slightly swollen lower extremities. She denies any dysuria, incontinence, nocturia, polyuria, or hematuria.   MEDICAL HISTORY: Past Medical History  Diagnosis Date  . Breast cancer     INTERIM HISTORY: has Breast cancer on her problem list.   stage II (T2, N0, M0) infiltrating ductal carcinoma left breast status post lumpectomy and axillary node dissection. She had 7 negative lymph nodes. Estrogen receptors 70% positive, progesterone receptors 91% positive HER-2/neu 0, Ki-67 marker low 6% and this was a grade 1 cancer. Her date of surgery was 09/21/2003 and she was treated on CALGB-40101 protocol and received Adriamycin and Cytoxan x4 cycles completed as of 12/23/2003. She was then treated with radiation therapy to the left breast followed by 5 years of Arimidex and she finished that as of mid April 2010.  ALLERGIES:  has No Known Allergies.  MEDICATIONS: has a current medication list which includes  the following prescription(s): multivitamin, stress formula, calcium carbonate, and cod liver oil.  SURGICAL HISTORY:  Past Surgical History  Procedure Laterality Date  . Mastectomy, partial  11/04    left  . Mastectomy w/ nodes partial  11/04    left  . Tonsilectomy, adenoidectomy, bilateral myringotomy and tubes    . Tubal ligation      FAMILY HISTORY: family history includes Cancer in her father and maternal grandmother.  SOCIAL HISTORY:  reports that she has never smoked. She has never used smokeless tobacco. She reports that she does not drink alcohol or use illicit drugs.  REVIEW OF SYSTEMS:  Other than that discussed above is noncontributory.  PHYSICAL EXAMINATION: ECOG PERFORMANCE STATUS: 0 - Asymptomatic  Blood pressure 144/75, pulse 82, temperature 97.4 F (36.3 C), resp. rate 16, weight 212 lb (96.163 kg).  GENERAL:alert, no distress and comfortable SKIN: skin color, texture, turgor are normal, no rashes or significant lesions EYES: PERLA; Conjunctiva are pink and non-injected, sclera clear OROPHARYNX:no exudate, no erythema on lips, buccal mucosa, or tongue. NECK: supple, thyroid normal size, non-tender, without nodularity. No masses CHEST: Status post left breast lumpectomy with slight hyperpigmentation changes of radiation. No masses are felt in either breast. LYMPH:  no palpable lymphadenopathy in the cervical, axillary or inguinal LUNGS: clear to auscultation and percussion with normal breathing effort HEART: regular rate & rhythm and no murmurs and no lower extremity edema ABDOMEN:abdomen soft, non-tender and normal bowel sounds MUSCULOSKELETAL:no cyanosis of digits and no clubbing. Range of motion normal.  NEURO: alert & oriented x 3 with fluent speech, no focal motor/sensory deficits   LABORATORY DATA: Office Visit on 07/28/2013  Component Date Value Range Status  . WBC 07/28/2013 6.1  4.0 - 10.5 K/uL Final  . RBC 07/28/2013 4.75  3.87 - 5.11 MIL/uL Final   . Hemoglobin 07/28/2013 14.1  12.0 - 15.0 g/dL Final  . HCT 40/98/1191 42.5  36.0 - 46.0 % Final  . MCV 07/28/2013 89.5  78.0 - 100.0 fL Final  . MCH 07/28/2013 29.7  26.0 - 34.0 pg Final  . MCHC 07/28/2013 33.2  30.0 - 36.0 g/dL Final  . RDW 47/82/9562 13.5  11.5 - 15.5 % Final  . Platelets 07/28/2013 259  150 - 400 K/uL Final  . Neutrophils Relative % 07/28/2013 49  43 - 77 % Final  . Neutro Abs 07/28/2013 3.0  1.7 - 7.7 K/uL Final  . Lymphocytes Relative 07/28/2013 36  12 - 46 % Final  . Lymphs Abs 07/28/2013 2.2  0.7 - 4.0 K/uL Final  . Monocytes Relative 07/28/2013 12  3 - 12 % Final  . Monocytes Absolute 07/28/2013 0.7  0.1 - 1.0 K/uL Final  . Eosinophils Relative 07/28/2013 3  0 - 5 % Final  . Eosinophils Absolute 07/28/2013 0.2  0.0 - 0.7 K/uL Final  . Basophils Relative 07/28/2013 1  0 - 1 % Final  . Basophils Absolute 07/28/2013 0.0  0.0 - 0.1 K/uL Final  . Sodium 07/28/2013 141  135 - 145 mEq/L Final  . Potassium 07/28/2013 4.1  3.5 - 5.1 mEq/L Final  . Chloride 07/28/2013 103  96 - 112 mEq/L Final  . CO2 07/28/2013 29  19 - 32 mEq/L Final  . Glucose, Bld 07/28/2013 96  70 - 99 mg/dL Final  . BUN 13/05/6577 8  6 - 23 mg/dL Final  . Creatinine, Ser 07/28/2013 0.84  0.50 - 1.10 mg/dL Final  . Calcium 46/96/2952 9.8  8.4 - 10.5 mg/dL Final  . Total Protein 07/28/2013 7.2  6.0 - 8.3 g/dL Final  . Albumin 84/13/2440 3.8  3.5 - 5.2 g/dL Final  . AST 08/18/2535 17  0 - 37 U/L Final  . ALT 07/28/2013 13  0 - 35 U/L Final  . Alkaline Phosphatase 07/28/2013 57  39 - 117 U/L Final  . Total Bilirubin 07/28/2013 0.5  0.3 - 1.2 mg/dL Final  . GFR calc non Af Amer 07/28/2013 71* >90 mL/min Final  . GFR calc Af Amer 07/28/2013 83* >90 mL/min Final   Comment: (NOTE)                          The eGFR has been calculated using the CKD EPI equation.                          This calculation has not been validated in all clinical situations.                          eGFR's persistently  <90 mL/min signify possible Chronic Kidney                          Disease.    PATHOLOGY:  Urinalysis No results found for this basename: colorurine,  appearanceur,  labspec,  phurine,  glucoseu,  hgbur,  bilirubinur,  ketonesur,  proteinur,  urobilinogen,  nitrite,  leukocytesur    RADIOGRAPHIC STUDIES: No results found.  ASSESSMENT: #1.stage II (T2, N0, M0) infiltrating ductal carcinoma left breast status post lumpectomy and axillary node dissection. She had 7 negative lymph nodes. Estrogen  receptors 70% positive, progesterone receptors 91% positive HER-2/neu 0, Ki-67 marker low 6% and this was a grade 1 cancer. Her date of surgery was 09/21/2003 and she was treated on CALGB-40101 protocol and received Adriamycin and Cytoxan x4 cycles completed as of 12/23/2003. She was then treated with radiation therapy to the left breast followed by 5 years of Arimidex and she finished that as of mid April 2010. Currently without evidence of disease. #2. Gastroesophageal reflux disease, stable on treatment.   PLAN: #1. Mammography was ordered. #2. Lab tests are abnormal, additional workup will be performed. #3. Continue monthly self breast examination and call if any new symptoms occur that are troublesome and persistent. #4. Followup in one year.   All questions were answered. The patient knows to call the clinic with any problems, questions or concerns. We can certainly see the patient much sooner if necessary.   I spent 30 minutes counseling the patient face to face. The total time spent in the appointment was 25 minutes.    Maurilio Lovely, MD 07/28/2013 10:59 AM

## 2013-07-31 ENCOUNTER — Ambulatory Visit (HOSPITAL_COMMUNITY)
Admission: RE | Admit: 2013-07-31 | Discharge: 2013-07-31 | Disposition: A | Discharge status: Home / Self Care | Payer: Medicare Other | Source: Ambulatory Visit | Attending: Hematology and Oncology | Admitting: Hematology and Oncology

## 2013-07-31 DIAGNOSIS — C50919 Malignant neoplasm of unspecified site of unspecified female breast: Secondary | ICD-10-CM

## 2013-07-31 DIAGNOSIS — Z1231 Encounter for screening mammogram for malignant neoplasm of breast: Secondary | ICD-10-CM | POA: Insufficient documentation

## 2014-07-28 ENCOUNTER — Ambulatory Visit (HOSPITAL_COMMUNITY): Payer: Medicare Other

## 2014-08-05 ENCOUNTER — Encounter (HOSPITAL_COMMUNITY): Payer: Self-pay | Admitting: Oncology

## 2014-08-05 NOTE — Progress Notes (Signed)
Katherine Kilts, MD [place] [address]  Breast cancer, unspecified laterality - Plan: Influenza vac split quadrivalent PF (FLUARIX) injection 0.5 mL  CURRENT THERAPY: Surveillance per NCCN guidelines  INTERVAL HISTORY: IDORA BROSIOUS 68 y.o. female returns for  regular  visit for followup of stage II (T2, N0, M0) infiltrating ductal carcinoma left breast status post lumpectomy and axillary node dissection. She had 7 negative lymph nodes. Estrogen receptors 70% positive, progesterone receptors 91% positive HER-2/neu 0, Ki-67 marker low 6% and this was a grade 1 cancer. Her date of surgery was 09/21/2003 and she was treated on CALGB-40101 protocol and received Adriamycin and Cytoxan x4 cycles completed as of 12/23/2003. She was then treated with radiation therapy to the left breast followed by 5 years of Arimidex and she finished that as of mid April 2010.  I personally reviewed and went over laboratory results with the patient.  The results are noted within this dictation.  I personally reviewed and went over radiographic studies with the patient.  The results are noted within this dictation.  Mammogram in October 2014 was BIRADS 1.  She will be due for her screening mammogram this month.  She reports that "I am cancer free and I want to be released."  It is certainly appropriate to release the patient from the [facility] since she is 10 years out from diagnosis and 5 years out from all therapy.  NCCN guidelines are reviewed and she is to have annual mammography, annual breast exam by primary care provider, and monthly self breast exams.  She is agreeable to this plan.  Oncologically, she denies any complaints and ROS questioning is negative.   Past Medical History  Diagnosis Date  . Breast cancer   . Infiltrating ductal carcinoma of left breast 07/18/2013    has Infiltrating ductal carcinoma of left breast on her problem list.     has No Known  Allergies.  Ms. Heyward does not currently have medications on file.  Past Surgical History  Procedure Laterality Date  . Mastectomy, partial  11/04    left  . Mastectomy w/ nodes partial  11/04    left  . Tonsilectomy, adenoidectomy, bilateral myringotomy and tubes    . Tubal ligation      Denies any headaches, dizziness, double vision, fevers, chills, night sweats, nausea, vomiting, diarrhea, constipation, chest pain, heart palpitations, shortness of breath, blood in stool, black tarry stool, urinary pain, urinary burning, urinary frequency, hematuria.   PHYSICAL EXAMINATION  ECOG PERFORMANCE STATUS: 0 - Asymptomatic  Filed Vitals:   08/06/14 0931  BP: 156/80  Pulse: 81  Temp: 97.9 F (36.6 C)  Resp: 16    GENERAL:alert, healthy, no distress, well nourished, well developed, comfortable, cooperative, obese and smiling SKIN: skin color, texture, turgor are normal, no rashes or significant lesions HEAD: Normocephalic, No masses, lesions, tenderness or abnormalities EYES: normal, PERRLA, EOMI, Conjunctiva are pink and non-injected EARS: External ears normal OROPHARYNX:lips, buccal mucosa, and tongue normal and mucous membranes are moist  NECK: supple, no adenopathy, thyroid normal size, non-tender, without nbreasts appear normal, no suspicious masses, no skin or nipple changes or axillary nodesodularity, no stridor, non-tender, trachea midline LYMPH:  no palpable lymphadenopathy, no hepatosplenomegaly BREAST: LUNGS: clear to auscultation and percussion HEART: regular rate & rhythm, no murmurs, no gallops, S1 normal and S2 normal ABDOMEN:abdomen soft, non-tender, obese, normal bowel sounds, no masses or organomegaly and no hepatosplenomegaly BACK: Back symmetric, no curvature., No  CVA tenderness, no curvature to forward bending, no skin lesions, erythema or scars, no tenderness to percussion or palpation EXTREMITIES:less then 2 second capillary refill, no joint deformities,  effusion, or inflammation, no edema, no skin discoloration, no clubbing, no cyanosis  NEURO: alert & oriented x 3 with fluent speech, no focal motor/sensory deficits, gait normal   LABORATORY DATA: CBC    Component Value Date/Time   WBC 6.1 07/28/2013 0921   RBC 4.75 07/28/2013 0921   HGB 14.1 07/28/2013 0921   HCT 42.5 07/28/2013 0921   PLT 259 07/28/2013 0921   MCV 89.5 07/28/2013 0921   MCH 29.7 07/28/2013 0921   MCHC 33.2 07/28/2013 0921   RDW 13.5 07/28/2013 0921   LYMPHSABS 2.2 07/28/2013 0921   MONOABS 0.7 07/28/2013 0921   EOSABS 0.2 07/28/2013 0921   BASOSABS 0.0 07/28/2013 0921      Chemistry      Component Value Date/Time   NA 141 07/28/2013 0921   K 4.1 07/28/2013 0921   CL 103 07/28/2013 0921   CO2 29 07/28/2013 0921   BUN 8 07/28/2013 0921   CREATININE 0.84 07/28/2013 0921      Component Value Date/Time   CALCIUM 9.8 07/28/2013 0921   ALKPHOS 57 07/28/2013 0921   AST 17 07/28/2013 0921   ALT 13 07/28/2013 0921   BILITOT 0.5 07/28/2013 0921     Lab Results  Component Value Date   LABCA2 24 07/28/2013   Lab Results  Component Value Date   CEA <0.5 07/28/2013      RADIOGRAPHIC STUDIES:  08/03/2013  CLINICAL DATA: Screening.  EXAM:  DIGITAL SCREENING BILATERAL MAMMOGRAM WITH CAD  COMPARISON: Previous exam(s).  ACR Breast Density Category b: There are scattered areas of  fibroglandular density.  FINDINGS:  There are no findings suspicious for malignancy. Images were  processed with CAD.  IMPRESSION:  No mammographic evidence of malignancy. A result letter of this  screening mammogram will be mailed directly to the patient.  RECOMMENDATION:  Screening mammogram in one year. (Code:SM-B-01Y)  BI-RADS CATEGORY 1: Negative  Electronically Signed  By: Abelardo Diesel M.D.  On: 08/03/2013 11:10     ASSESSMENT: 1. Stage II (T2, N0, M0) infiltrating ductal carcinoma left breast status post lumpectomy and axillary node dissection. She had 7 negative lymph nodes.  Estrogen receptors 70% positive, progesterone receptors 91% positive HER-2/neu 0, Ki-67 marker low 6% and this was a grade 1 cancer. Her date of surgery was 09/21/2003 and she was treated on CALGB-40101 protocol and received Adriamycin and Cytoxan x4 cycles completed as of 12/23/2003. She was then treated with radiation therapy to the left breast followed by 5 years of Arimidex and she finished that as of mid April 2010.   PLAN:  1. I personally reviewed and went over laboratory results with the patient.  The results are noted within this dictation. 2. I personally reviewed and went over radiographic studies with the patient.  The results are noted within this dictation.   3. Next screening mammogram is due this month. 4. No role for labs from an oncology standpoint. 5. Influenza vaccine given today 6. Patient requests release from clinic and she is advised to follow-up with primary care provider for annual mammography and annual breast exams.  She is to perform monthly self breast exams.   THERAPY PLAN:  NCCN guidelines recommends the following surveillance for invasive breast cancer:  A. History and Physical exam every 4-6 months for 5 years and then every 12 months.  B. Mammography every 12 months  C. Women on Tamoxifen: annual gynecologic assessment every 12 months if uterus is present.  D. Women on aromatase inhibitor or who experience ovarian failure secondary to treatment should have monitoring of bone health with a bone mineral density determination at baseline and periodically thereafter.  E. Assess and encourage adherence to adjuvant endocrine therapy.  F. Evidence suggests that active lifestyle and achieving and maintaining an ideal body weight (20-25 BMI) may lead to optimal breast cancer outcomes.   All questions were answered. The patient knows to call the clinic with any problems, questions or concerns. We can certainly see the patient much sooner if necessary.  Patient and  plan discussed with Dr. Farrel Gobble and he is in agreement with the aforementioned.   More than 50% of the time spent with the patient was utilized for counseling and coordination of care.  KEFALAS,THOMAS 08/06/2014

## 2014-08-06 ENCOUNTER — Encounter (HOSPITAL_COMMUNITY): Payer: Medicare Other | Attending: Oncology | Admitting: Oncology

## 2014-08-06 ENCOUNTER — Encounter (HOSPITAL_COMMUNITY): Payer: Self-pay | Admitting: Oncology

## 2014-08-06 VITALS — BP 156/80 | HR 81 | Temp 97.9°F | Resp 16 | Wt 213.6 lb

## 2014-08-06 DIAGNOSIS — C50919 Malignant neoplasm of unspecified site of unspecified female breast: Secondary | ICD-10-CM

## 2014-08-06 DIAGNOSIS — Z23 Encounter for immunization: Secondary | ICD-10-CM

## 2014-08-06 DIAGNOSIS — Z853 Personal history of malignant neoplasm of breast: Secondary | ICD-10-CM

## 2014-08-06 MED ORDER — INFLUENZA VAC SPLIT QUAD 0.5 ML IM SUSY
0.5000 mL | PREFILLED_SYRINGE | Freq: Once | INTRAMUSCULAR | Status: AC
  Administered 2014-08-06: 0.5 mL via INTRAMUSCULAR
  Filled 2014-08-06: qty 0.5

## 2014-08-06 NOTE — Patient Instructions (Signed)
[facility] Discharge Instructions  RECOMMENDATIONS MADE BY THE CONSULTANT AND ANY TEST RESULTS WILL BE SENT TO YOUR REFERRING PHYSICIAN.  MEDICATIONS PRESCRIBED:  None  INSTRUCTIONS GIVEN AND DISCUSSED: Mammogram annually. Wellness exam with breast exam annually with primary care provider  SPECIAL INSTRUCTIONS/FOLLOW-UP: Release from the [facility].  Thank you for choosing [facility] to provide your oncology and hematology care.  To afford each patient quality time with our providers, please arrive at least 15 minutes before your scheduled appointment time.  With your help, our goal is to use those 15 minutes to complete the necessary work-up to ensure our physicians have the information they need to help with your evaluation and healthcare recommendations.    Effective January 1st, 2014, we ask that you re-schedule your appointment with our physicians should you arrive 10 or more minutes late for your appointment.  We strive to give you quality time with our providers, and arriving late affects you and other patients whose appointments are after yours.    Again, thank you for choosing [facility].  Our hope is that these requests will decrease the amount of time that you wait before being seen by our physicians.       _____________________________________________________________  Should you have questions after your visit to [facility], please contact our office at [phone] between the hours of 8:30 a.m. and 5:00 p.m.  Voicemails left after 4:30 p.m. will not be returned until the following business day.  For prescription refill requests, have your pharmacy contact our office with your prescription refill request.

## 2014-08-06 NOTE — Progress Notes (Signed)
Katherine Mcmillan presents today for injection per MD orders. Flu Vaccine administered IN in right Deltoid. Administration without incident. Patient tolerated well.

## 2014-08-09 ENCOUNTER — Other Ambulatory Visit (HOSPITAL_COMMUNITY): Payer: Self-pay | Admitting: Family Medicine

## 2014-08-09 DIAGNOSIS — Z1231 Encounter for screening mammogram for malignant neoplasm of breast: Secondary | ICD-10-CM

## 2014-08-12 ENCOUNTER — Ambulatory Visit (HOSPITAL_COMMUNITY)
Admission: RE | Admit: 2014-08-12 | Discharge: 2014-08-12 | Disposition: A | Discharge status: Home / Self Care | Payer: Medicare HMO | Source: Ambulatory Visit | Attending: Family Medicine | Admitting: Family Medicine

## 2014-08-12 DIAGNOSIS — Z1231 Encounter for screening mammogram for malignant neoplasm of breast: Secondary | ICD-10-CM | POA: Diagnosis not present

## 2015-07-01 ENCOUNTER — Other Ambulatory Visit (HOSPITAL_COMMUNITY): Payer: Self-pay | Admitting: Family Medicine

## 2015-07-01 DIAGNOSIS — Z1389 Encounter for screening for other disorder: Secondary | ICD-10-CM

## 2015-07-08 ENCOUNTER — Ambulatory Visit (HOSPITAL_COMMUNITY)
Admission: RE | Admit: 2015-07-08 | Discharge: 2015-07-08 | Disposition: A | Discharge status: Home / Self Care | Payer: Medicare HMO | Source: Ambulatory Visit | Attending: Family Medicine | Admitting: Family Medicine

## 2015-07-08 DIAGNOSIS — Z780 Asymptomatic menopausal state: Secondary | ICD-10-CM | POA: Diagnosis present

## 2015-07-08 DIAGNOSIS — Z1389 Encounter for screening for other disorder: Secondary | ICD-10-CM

## 2015-08-15 DIAGNOSIS — Z23 Encounter for immunization: Secondary | ICD-10-CM | POA: Diagnosis not present

## 2015-09-06 DIAGNOSIS — Z008 Encounter for other general examination: Secondary | ICD-10-CM | POA: Diagnosis not present

## 2016-01-14 DIAGNOSIS — J019 Acute sinusitis, unspecified: Secondary | ICD-10-CM | POA: Diagnosis not present

## 2016-01-14 DIAGNOSIS — R030 Elevated blood-pressure reading, without diagnosis of hypertension: Secondary | ICD-10-CM | POA: Diagnosis not present

## 2016-07-20 ENCOUNTER — Other Ambulatory Visit (HOSPITAL_COMMUNITY): Payer: Self-pay | Admitting: Family Medicine

## 2016-07-20 DIAGNOSIS — Z1231 Encounter for screening mammogram for malignant neoplasm of breast: Secondary | ICD-10-CM

## 2016-07-27 ENCOUNTER — Ambulatory Visit (HOSPITAL_COMMUNITY)
Admission: RE | Admit: 2016-07-27 | Discharge: 2016-07-27 | Disposition: A | Discharge status: Home / Self Care | Payer: Medicare HMO | Source: Ambulatory Visit | Attending: Family Medicine | Admitting: Family Medicine

## 2016-07-27 DIAGNOSIS — Z1231 Encounter for screening mammogram for malignant neoplasm of breast: Secondary | ICD-10-CM | POA: Insufficient documentation

## 2016-08-14 DIAGNOSIS — R69 Illness, unspecified: Secondary | ICD-10-CM | POA: Diagnosis not present

## 2016-08-21 DIAGNOSIS — H2513 Age-related nuclear cataract, bilateral: Secondary | ICD-10-CM | POA: Diagnosis not present

## 2016-09-06 DIAGNOSIS — Z23 Encounter for immunization: Secondary | ICD-10-CM | POA: Diagnosis not present

## 2016-09-18 DIAGNOSIS — Z1389 Encounter for screening for other disorder: Secondary | ICD-10-CM | POA: Diagnosis not present

## 2016-09-18 DIAGNOSIS — E782 Mixed hyperlipidemia: Secondary | ICD-10-CM | POA: Diagnosis not present

## 2016-09-18 DIAGNOSIS — Z0000 Encounter for general adult medical examination without abnormal findings: Secondary | ICD-10-CM | POA: Diagnosis not present

## 2016-12-06 DIAGNOSIS — Z6831 Body mass index (BMI) 31.0-31.9, adult: Secondary | ICD-10-CM | POA: Diagnosis not present

## 2016-12-06 DIAGNOSIS — Z0000 Encounter for general adult medical examination without abnormal findings: Secondary | ICD-10-CM | POA: Diagnosis not present

## 2016-12-06 DIAGNOSIS — Z7982 Long term (current) use of aspirin: Secondary | ICD-10-CM | POA: Diagnosis not present

## 2016-12-06 DIAGNOSIS — E669 Obesity, unspecified: Secondary | ICD-10-CM | POA: Diagnosis not present

## 2017-07-22 ENCOUNTER — Telehealth: Payer: Self-pay | Admitting: General Practice

## 2017-07-22 NOTE — Telephone Encounter (Signed)
Patient called to schedule her repeat tcs, last one done by [facility] 07/2007.  She is not having any problems and can be reached at [phone]

## 2017-07-23 ENCOUNTER — Telehealth: Payer: Self-pay

## 2017-07-23 NOTE — Telephone Encounter (Signed)
See separate triage.  

## 2017-07-30 NOTE — Telephone Encounter (Signed)
Gastroenterology Pre-Procedure Review  Request Date: 07/23/2017 Requesting Physician: Dr.Golding  PATIENT REVIEW QUESTIONS: The patient responded to the following health history questions as indicated:    1. Diabetes Melitis: NO 2. Joint replacements in the past 12 months: no 3. Major health problems in the past 3 months: no 4. Has an artificial valve or MVP: no 5. Has a defibrillator: no 6. Has been advised in past to take antibiotics in advance of a procedure like teeth cleaning: no 7. Family history of colon cancer: no  8. Alcohol Use: no 9. History of sleep apnea: no  10. History of coronary artery or other vascular stents placed within the last 12 months: no 11. History of any prior anesthesia complications: no    MEDICATIONS & ALLERGIES:    Patient reports the following regarding taking any blood thinners:   Plavix? no Aspirin? no Coumadin? no Brilinta? no Xarelto? no Eliquis? no Pradaxa? no Savaysa? no Effient? no  Patient confirms/reports the following medications:  Current Outpatient Prescriptions  Medication Sig Dispense Refill  . calcium carbonate (OS-CAL) 600 MG TABS Take 500 mg by mouth 3 (three) times daily with meals.     Marland Kitchen Cod Liver Oil 1000 MG CAPS Take 1,000 mg by mouth 2 (two) times daily.      . Multiple Vitamins-Minerals (CENTRUM SILVER PO) Take by mouth.    . B Complex-C-Folic Acid (MULTIVITAMIN, STRESS FORMULA) tablet Take 1 tablet by mouth.       No current facility-administered medications for this visit.     Patient confirms/reports the following allergies:  No Known Allergies  No orders of the defined types were placed in this encounter.   AUTHORIZATION INFORMATION Primary Insurance:  ID #:   Group #:  Pre-Cert / Auth required:  Pre-Cert / Auth #:   Secondary Insurance:   ID #:   Group #:  Pre-Cert / Auth required:  Pre-Cert / Auth #:   SCHEDULE INFORMATION: Procedure has been scheduled as follows:  Date  08/16/2017             Time:11:30 AM Location: [facility] Short Stay  This Gastroenterology Pre-Precedure Review Form is being routed to the following provider(s): R. Garfield Cornea, MD

## 2017-07-31 NOTE — Telephone Encounter (Signed)
Ok to schedule.

## 2017-08-01 DIAGNOSIS — H40033 Anatomical narrow angle, bilateral: Secondary | ICD-10-CM | POA: Diagnosis not present

## 2017-08-01 DIAGNOSIS — H40031 Anatomical narrow angle, right eye: Secondary | ICD-10-CM | POA: Diagnosis not present

## 2017-08-01 DIAGNOSIS — H2513 Age-related nuclear cataract, bilateral: Secondary | ICD-10-CM | POA: Diagnosis not present

## 2017-08-05 ENCOUNTER — Other Ambulatory Visit: Payer: Self-pay

## 2017-08-05 DIAGNOSIS — Z86010 Personal history of colonic polyps: Secondary | ICD-10-CM

## 2017-08-05 MED ORDER — NA SULFATE-K SULFATE-MG SULF 17.5-3.13-1.6 GM/177ML PO SOLN: 1.0000 | ORAL | 0 refills | Status: DC

## 2017-08-05 NOTE — Telephone Encounter (Signed)
Rx sent to the pharmacy and instructions mailed to pt.  

## 2017-08-14 ENCOUNTER — Other Ambulatory Visit (HOSPITAL_COMMUNITY): Payer: Self-pay | Admitting: Family Medicine

## 2017-08-14 DIAGNOSIS — Z1231 Encounter for screening mammogram for malignant neoplasm of breast: Secondary | ICD-10-CM

## 2017-08-15 ENCOUNTER — Telehealth: Payer: Self-pay | Admitting: Internal Medicine

## 2017-08-15 NOTE — Telephone Encounter (Signed)
Patient called and stated she never got her instructions in the mail and she did not have clear liquids yesterday   Her procedure is tomorrow  [phone]

## 2017-08-15 NOTE — Telephone Encounter (Signed)
Melanie at [facility] called office. She spoke to Dr. Gala Romney and they're going to still do colonoscopy tomorrow. She said I didn't need to call pt.

## 2017-08-15 NOTE — OR Nursing (Signed)
Called patient to verify procedure time. Patient stated that she never got her instructions and had left a message at the office. Patient stated she ate all day yesterday and had a boiled egg for breakfast. Dr. Gala Romney informed and said for patient to stay on clear liquids and complete prep and we will proceed with procedure. Called patient back and informed of the above and went over Suprep instructions. Patient verbalized understanding.

## 2017-08-16 ENCOUNTER — Ambulatory Visit (HOSPITAL_COMMUNITY)
Admission: RE | Admit: 2017-08-16 | Discharge: 2017-08-16 | Disposition: A | Discharge status: Home / Self Care | Payer: Medicare HMO | Source: Ambulatory Visit | Attending: Internal Medicine | Admitting: Internal Medicine

## 2017-08-16 ENCOUNTER — Encounter (HOSPITAL_COMMUNITY): Payer: Self-pay | Admitting: *Deleted

## 2017-08-16 ENCOUNTER — Encounter (HOSPITAL_COMMUNITY)
Admission: RE | Disposition: A | Discharge status: Home / Self Care | Payer: Self-pay | Source: Ambulatory Visit | Attending: Internal Medicine

## 2017-08-16 DIAGNOSIS — D122 Benign neoplasm of ascending colon: Secondary | ICD-10-CM | POA: Insufficient documentation

## 2017-08-16 DIAGNOSIS — Z853 Personal history of malignant neoplasm of breast: Secondary | ICD-10-CM | POA: Diagnosis not present

## 2017-08-16 DIAGNOSIS — Z86010 Personal history of colonic polyps: Secondary | ICD-10-CM | POA: Insufficient documentation

## 2017-08-16 DIAGNOSIS — Z1212 Encounter for screening for malignant neoplasm of rectum: Secondary | ICD-10-CM | POA: Diagnosis not present

## 2017-08-16 DIAGNOSIS — K5730 Diverticulosis of large intestine without perforation or abscess without bleeding: Secondary | ICD-10-CM | POA: Insufficient documentation

## 2017-08-16 DIAGNOSIS — D125 Benign neoplasm of sigmoid colon: Secondary | ICD-10-CM | POA: Diagnosis not present

## 2017-08-16 DIAGNOSIS — D124 Benign neoplasm of descending colon: Secondary | ICD-10-CM

## 2017-08-16 DIAGNOSIS — Z7982 Long term (current) use of aspirin: Secondary | ICD-10-CM | POA: Diagnosis not present

## 2017-08-16 DIAGNOSIS — Z1211 Encounter for screening for malignant neoplasm of colon: Secondary | ICD-10-CM | POA: Insufficient documentation

## 2017-08-16 DIAGNOSIS — Z79899 Other long term (current) drug therapy: Secondary | ICD-10-CM | POA: Diagnosis not present

## 2017-08-16 HISTORY — PX: POLYPECTOMY: SHX5525

## 2017-08-16 HISTORY — PX: COLONOSCOPY: SHX5424

## 2017-08-16 SURGERY — COLONOSCOPY
Anesthesia: Moderate Sedation

## 2017-08-16 MED ORDER — ONDANSETRON HCL 4 MG/2ML IJ SOLN
INTRAMUSCULAR | Status: DC | PRN
  Administered 2017-08-16: 4 mg via INTRAVENOUS

## 2017-08-16 MED ORDER — ONDANSETRON HCL 4 MG/2ML IJ SOLN
INTRAMUSCULAR | Status: DC
Start: 2017-08-16 — End: 2017-08-16
  Filled 2017-08-16: qty 2

## 2017-08-16 MED ORDER — MIDAZOLAM HCL 5 MG/5ML IJ SOLN
INTRAMUSCULAR | Status: DC | PRN
  Administered 2017-08-16: 1 mg via INTRAVENOUS
  Administered 2017-08-16: 2 mg via INTRAVENOUS
  Administered 2017-08-16: 1 mg via INTRAVENOUS

## 2017-08-16 MED ORDER — SODIUM CHLORIDE 0.9 % IV SOLN
INTRAVENOUS | Status: DC
  Administered 2017-08-16: 12:00:00 via INTRAVENOUS

## 2017-08-16 MED ORDER — MEPERIDINE HCL 100 MG/ML IJ SOLN
INTRAMUSCULAR | Status: AC
  Filled 2017-08-16: qty 2

## 2017-08-16 MED ORDER — MIDAZOLAM HCL 5 MG/5ML IJ SOLN
INTRAMUSCULAR | Status: AC
  Filled 2017-08-16: qty 10

## 2017-08-16 MED ORDER — MEPERIDINE HCL 100 MG/ML IJ SOLN
INTRAMUSCULAR | Status: DC | PRN
  Administered 2017-08-16: 50 mg via INTRAVENOUS
  Administered 2017-08-16 (×2): 25 mg via INTRAVENOUS

## 2017-08-16 NOTE — Discharge Instructions (Addendum)
°Colonoscopy °Discharge Instructions ° °Read the instructions outlined below and refer to this sheet in the next few weeks. These discharge instructions provide you with general information on caring for yourself after you leave the hospital. Your doctor may also give you specific instructions. While your treatment has been planned according to the most current medical practices available, unavoidable complications occasionally occur. If you have any problems or questions after discharge, call Dr. Rourk at [phone]. °ACTIVITY °· You may resume your regular activity, but move at a slower pace for the next 24 hours.  °· Take frequent rest periods for the next 24 hours.  °· Walking will help get rid of the air and reduce the bloated feeling in your belly (abdomen).  °· No driving for 24 hours (because of the medicine (anesthesia) used during the test).   °· Do not sign any important legal documents or operate any machinery for 24 hours (because of the anesthesia used during the test).  °NUTRITION °· Drink plenty of fluids.  °· You may resume your normal diet as instructed by your doctor.  °· Begin with a light meal and progress to your normal diet. Heavy or fried foods are harder to digest and may make you feel sick to your stomach (nauseated).  °· Avoid alcoholic beverages for 24 hours or as instructed.  °MEDICATIONS °· You may resume your normal medications unless your doctor tells you otherwise.  °WHAT YOU CAN EXPECT TODAY °· Some feelings of bloating in the abdomen.  °· Passage of more gas than usual.  °· Spotting of blood in your stool or on the toilet paper.  °IF YOU HAD POLYPS REMOVED DURING THE COLONOSCOPY: °· No aspirin products for 7 days or as instructed.  °· No alcohol for 7 days or as instructed.  °· Eat a soft diet for the next 24 hours.  °FINDING OUT THE RESULTS OF YOUR TEST °Not all test results are available during your visit. If your test results are not back during the visit, make an appointment  with your caregiver to find out the results. Do not assume everything is normal if you have not heard from your caregiver or the medical facility. It is important for you to follow up on all of your test results.  °SEEK IMMEDIATE MEDICAL ATTENTION IF: °· You have more than a spotting of blood in your stool.  °· Your belly is swollen (abdominal distention).  °· You are nauseated or vomiting.  °· You have a temperature over 101.  °· You have abdominal pain or discomfort that is severe or gets worse throughout the day.  ° ° °Colon polyp and diverticulosis information provided ° °Further recommendations to follow pending review of pathology report ° ° °Colon Polyps °Polyps are tissue growths inside the body. Polyps can grow in many places, including the large intestine (colon). A polyp may be a round bump or a mushroom-shaped growth. You could have one polyp or several. °Most colon polyps are noncancerous (benign). However, some colon polyps can become cancerous over time. °What are the causes? °The exact cause of colon polyps is not known. °What increases the risk? °This condition is more likely to develop in people who: °· Have a family history of colon cancer or colon polyps. °· Are older than 50 or older than 45 if they are African American. °· Have inflammatory bowel disease, such as ulcerative colitis or Crohn disease. °· Are overweight. °· Smoke cigarettes. °· Do not get enough exercise. °· Drink too   much alcohol. °· Eat a diet that is: °? High in fat and red meat. °? Low in fiber. °· Had childhood cancer that was treated with abdominal radiation. ° °What are the signs or symptoms? °Most polyps do not cause symptoms. If you have symptoms, they may include: °· Blood coming from your rectum when having a bowel movement. °· Blood in your stool. The stool may look dark red or black. °· A change in bowel habits, such as constipation or diarrhea. ° °How is this diagnosed? °This condition is diagnosed with a  colonoscopy. This is a procedure that uses a lighted, flexible scope to look at the inside of your colon. °How is this treated? °Treatment for this condition involves removing any polyps that are found. Those polyps will then be tested for cancer. If cancer is found, your health care provider will talk to you about options for colon cancer treatment. °Follow these instructions at home: °Diet °· Eat plenty of fiber, such as fruits, vegetables, and whole grains. °· Eat foods that are high in calcium and vitamin D, such as milk, cheese, yogurt, eggs, liver, fish, and broccoli. °· Limit foods high in fat, red meats, and processed meats, such as hot dogs, sausage, bacon, and lunch meats. °· Maintain a healthy weight, or lose weight if recommended by your health care provider. °General instructions °· Do not smoke cigarettes. °· Do not drink alcohol excessively. °· Keep all follow-up visits as told by your health care provider. This is important. This includes keeping regularly scheduled colonoscopies. Talk to your health care provider about when you need a colonoscopy. °· Exercise every day or as told by your health care provider. °Contact a health care provider if: °· You have new or worsening bleeding during a bowel movement. °· You have new or increased blood in your stool. °· You have a change in bowel habits. °· You unexpectedly lose weight. °This information is not intended to replace advice given to you by your health care provider. Make sure you discuss any questions you have with your health care provider. °Document Released: 07/04/2004 Document Revised: 03/15/2016 Document Reviewed: 08/29/2015 °Elsevier Interactive Patient Education © 2018 [employer] ° ° °Diverticulosis °Diverticulosis is a condition that develops when small pouches (diverticula) form in the wall of the large intestine (colon). The colon is where water is absorbed and stool is formed. The pouches form when the inside layer of the colon  pushes through weak spots in the outer layers of the colon. You may have a few pouches or many of them. °What are the causes? °The cause of this condition is not known. °What increases the risk? °The following factors may make you more likely to develop this condition: °· Being older than age 60. Your risk for this condition increases with age. Diverticulosis is rare among people younger than age 30. By age 80, many people have it. °· Eating a low-fiber diet. °· Having frequent constipation. °· Being overweight. °· Not getting enough exercise. °· Smoking. °· Taking over-the-counter pain medicines, like aspirin and ibuprofen. °· Having a family history of diverticulosis. ° °What are the signs or symptoms? °In most people, there are no symptoms of this condition. If you do have symptoms, they may include: °· Bloating. °· Cramps in the abdomen. °· Constipation or diarrhea. °· Pain in the lower left side of the abdomen. ° °How is this diagnosed? °This condition is most often diagnosed during an exam for other colon problems. Because diverticulosis usually has no   symptoms, it often cannot be diagnosed independently. This condition may be diagnosed by: °· Using a flexible scope to examine the colon (colonoscopy). °· Taking an X-ray of the colon after dye has been put into the colon (barium enema). °· Doing a CT scan. ° °How is this treated? °You may not need treatment for this condition if you have never developed an infection related to diverticulosis. If you have had an infection before, treatment may include: °· Eating a high-fiber diet. This may include eating more fruits, vegetables, and grains. °· Taking a fiber supplement. °· Taking a live bacteria supplement (probiotic). °· Taking medicine to relax your colon. °· Taking antibiotic medicines. ° °Follow these instructions at home: °· Drink 6-8 glasses of water or more each day to prevent constipation. °· Try not to strain when you have a bowel movement. °· If you  have had an infection before: °? Eat more fiber as directed by your health care provider or your diet and nutrition specialist (dietitian). °? Take a fiber supplement or probiotic, if your health care provider approves. °· Take over-the-counter and prescription medicines only as told by your health care provider. °· If you were prescribed an antibiotic, take it as told by your health care provider. Do not stop taking the antibiotic even if you start to feel better. °· Keep all follow-up visits as told by your health care provider. This is important. °Contact a health care provider if: °· You have pain in your abdomen. °· You have bloating. °· You have cramps. °· You have not had a bowel movement in 3 days. °Get help right away if: °· Your pain gets worse. °· Your bloating becomes very bad. °· You have a fever or chills, and your symptoms suddenly get worse. °· You vomit. °· You have bowel movements that are bloody or black. °· You have bleeding from your rectum. °Summary °· Diverticulosis is a condition that develops when small pouches (diverticula) form in the wall of the large intestine (colon). °· You may have a few pouches or many of them. °· This condition is most often diagnosed during an exam for other colon problems. °· If you have had an infection related to diverticulosis, treatment may include increasing the fiber in your diet, taking supplements, or taking medicines. °This information is not intended to replace advice given to you by your health care provider. Make sure you discuss any questions you have with your health care provider. °Document Released: 07/05/2004 Document Revised: 08/27/2016 Document Reviewed: 08/27/2016 °Elsevier Interactive Patient Education © 2017 Elsevier Inc. ° °

## 2017-08-16 NOTE — Op Note (Signed)
[facility] Patient Name: Katherine Mcmillan Procedure Date: 08/16/2017 10:35 AM MRN: [id number] Date of Birth: 1946-06-01 Attending MD: Norvel Richards , MD CSN: [id number] Age: 71 Admit Type: Outpatient Procedure:                Colonoscopy Indications:              Screening for colorectal malignant neoplasm Providers:                Norvel Richards, MD, Lurline Del, RN, Purcell Nails.                            [place], [employer] Referring MD:              Medicines:                Midazolam 4 mg IV, Meperidine 100 mg IV,                            Ondansetron 4 mg IV Complications:            No immediate complications. Estimated Blood Loss:     Estimated blood loss was minimal. Procedure:                Pre-Anesthesia Assessment:                           - Prior to the procedure, a History and Physical                            was performed, and patient medications and                            allergies were reviewed. The patient's tolerance of                            previous anesthesia was also reviewed. The risks                            and benefits of the procedure and the sedation                            options and risks were discussed with the patient.                            All questions were answered, and informed consent                            was obtained. Prior Anticoagulants: The patient has                            taken no previous anticoagulant or antiplatelet                            agents. ASA Grade Assessment: II - A patient with  mild systemic disease. After reviewing the risks                            and benefits, the patient was deemed in                            satisfactory condition to undergo the procedure.                           After obtaining informed consent, the colonoscope                            was passed under direct vision. Throughout the                            procedure, the  patient's blood pressure, pulse, and                            oxygen saturations were monitored continuously. The                            [id number]-[id number]) scope was introduced through                            the anus and advanced to the the cecum, identified                            by appendiceal orifice and ileocecal valve. The                            colonoscopy was performed without difficulty. The                            patient tolerated the procedure well. The quality                            of the bowel preparation was adequate. The                            ileocecal valve, appendiceal orifice, and rectum                            were photographed. The quality of the bowel                            preparation was adequate. The entire colon was well                            visualized. Scope In: 10:57:28 AM Scope Out: 11:14:30 AM Scope Withdrawal Time: 0 hours 8 minutes 51 seconds  Total Procedure Duration: 0 hours 17 minutes 2 seconds  Findings:      The perianal and digital rectal examinations were normal.      Multiple small and large-mouthed diverticula were found in the entire  colon.      Two sessile polyps were found in the sigmoid colon and ascending colon.       The polyps were 5 to 6 mm in size. These polyps were removed with a cold       snare. Resection and retrieval were complete. Estimated blood loss was       minimal.      The exam was otherwise without abnormality on direct and retroflexion       views. Impression:               - Diverticulosis in the entire examined colon.                           - Two 5 to 6 mm polyps in the sigmoid colon and in                            the ascending colon, removed with a cold snare.                            Resected and retrieved.                           - The examination was otherwise normal on direct                            and retroflexion views. Moderate Sedation:       Moderate (conscious) sedation was administered by the endoscopy nurse       and supervised by the endoscopist. The following parameters were       monitored: oxygen saturation, heart rate, blood pressure, respiratory       rate, EKG, adequacy of pulmonary ventilation, and response to care.       Total physician intraservice time was 15 minutes. Recommendation:           - Patient has a contact number available for                            emergencies. The signs and symptoms of potential                            delayed complications were discussed with the                            patient. Return to normal activities tomorrow.                            Written discharge instructions were provided to the                            patient.                           - Resume previous diet.                           - Patient has a contact number available for  emergencies. The signs and symptoms of potential                            delayed complications were discussed with the                            patient. Return to normal activities tomorrow.                            Written discharge instructions were provided to the                            patient.                           - Advance diet as tolerated.                           - Continue present medications.                           - Repeat colonoscopy date to be determined after                            pending pathology results are reviewed for                            surveillance based on pathology results.                           - Return to GI office (date not yet determined). Procedure Code(s):        --- Professional ---                           [phone], Colonoscopy, flexible; with removal of                            tumor(s), polyp(s), or other lesion(s) by snare                            technique                           99152, Moderate sedation services provided by  the                            same physician or other qualified health care                            professional performing the diagnostic or                            therapeutic service that the sedation supports,                            requiring the presence of  an independent trained                            observer to assist in the monitoring of the                            patient's level of consciousness and physiological                            status; initial 15 minutes of intraservice time,                            patient age 69 years or older Diagnosis Code(s):        --- Professional ---                           Z12.11, Encounter for screening for malignant                            neoplasm of colon                           D12.5, Benign neoplasm of sigmoid colon                           D12.2, Benign neoplasm of ascending colon                           K57.30, Diverticulosis of large intestine without                            perforation or abscess without bleeding CPT copyright 2016 American Medical Association. All rights reserved. The codes documented in this report are preliminary and upon coder review may  be revised to meet current compliance requirements. [name]. Kylii Ennis, MD Norvel Richards, MD 08/16/2017 11:21:19 AM This report has been signed electronically. Number of Addenda: 0

## 2017-08-16 NOTE — H&P (Addendum)
_0 @   Primary Care Physician:  Jake Samples, PA-C Primary Gastroenterologist:  Dr. Gala Romney  Pre-Procedure History & Physical: HPI:  Katherine Mcmillan is a 71 y.o. female here for screening colonoscopy. Single 4 mm adenoma removed from her  colonoscopy 10 years ago. No bowel symptoms. No family history of colon cancer.  Past Medical History:  Diagnosis Date  . Breast cancer ([place])   . Infiltrating ductal carcinoma of left breast ([place]) 07/18/2013    Past Surgical History:  Procedure Laterality Date  . MASTECTOMY W/ NODES PARTIAL  11/04   left  . MASTECTOMY, PARTIAL  11/04   left  . TONSILLECTOMY    . TUBAL LIGATION      Prior to Admission medications   Medication Sig Start Date End Date Taking? Authorizing Provider  aspirin EC 81 MG tablet Take 81 mg by mouth every 30 (thirty) days.   Yes [provider]  Carboxymethylcellulose Sodium (THERATEARS OP) Apply 1 drop to eye daily as needed (dry eyes).   Yes [provider]  Fish Oil-Cholecalciferol (OMEGA-3 + D PO) Take 1 capsule by mouth daily.   Yes [provider]  Multiple Vitamins-Minerals (CENTRUM SILVER PO) Take 1 tablet by mouth daily.    Yes [provider]  Na Sulfate-K Sulfate-Mg Sulf (SUPREP BOWEL PREP KIT) 17.5-3.13-1.6 GM/177ML SOLN Take 1 kit by mouth as directed. 08/05/17  Yes Mahala Menghini, PA-C  vitamin C (ASCORBIC ACID) 500 MG tablet Take 500 mg by mouth daily.   Yes [provider]  CALCIUM PO Take 500 mg by mouth 2 (two) times daily.    [provider]    Allergies as of 08/05/2017  . (No Known Allergies)    Family History  Problem Relation Age of Onset  . Cancer Father        lung  . Cancer Maternal Grandmother     Social History   Social History  . Marital status: Widowed    Spouse name: N/A  . Number of children: N/A  . Years of education: N/A   Occupational History  . Not on file.   Social History Main Topics  . Smoking status: Never  Smoker  . Smokeless tobacco: Never Used  . Alcohol use No  . Drug use: No  . Sexual activity: Not Currently   Other Topics Concern  . Not on file   Social History Narrative  . No narrative on file    Review of Systems: See HPI, otherwise negative ROS  Physical Exam: BP (!) 189/82   Pulse 94   Temp 97.9 F (36.6 C) (Oral)   Resp 18   Ht 5' 8" (1.727 m)   Wt 200 lb (90.7 kg)   SpO2 100%   BMI 30.41 kg/m  General:   Alert,  Well-developed, well-nourished, pleasant and cooperative in NAD Neck:  Supple; no masses or thyromegaly. No significant cervical adenopathy. Lungs:  Clear throughout to auscultation.   No wheezes, crackles, or rhonchi. No acute distress. Heart:  Regular rate and rhythm; no murmurs, clicks, rubs,  or gallops. Abdomen: Non-distended, normal bowel sounds.  Soft and nontender without appreciable mass or hepatosplenomegaly.  Pulses:  Normal pulses noted. Extremities:  Without clubbing or edema.  Impression:  Pleasant 71 year old lady here for a colonoscopy.  Recommendations: I have offered the patient a screening colonoscopy today. The risks, benefits, limitations, alternatives and imponderables have been reviewed with the patient. Questions have been answered. All parties are agreeable.  Notice: This dictation was prepared with Dragon dictation along with smaller phrase technology. Any transcriptional errors that result from this process are unintentional and may not be corrected upon review.

## 2017-08-20 ENCOUNTER — Encounter (HOSPITAL_COMMUNITY): Payer: Self-pay | Admitting: Internal Medicine

## 2017-08-20 ENCOUNTER — Encounter: Payer: Self-pay | Admitting: Internal Medicine

## 2017-08-22 ENCOUNTER — Ambulatory Visit (HOSPITAL_COMMUNITY)
Admission: RE | Admit: 2017-08-22 | Discharge: 2017-08-22 | Disposition: A | Discharge status: Home / Self Care | Payer: Medicare HMO | Source: Ambulatory Visit | Attending: Family Medicine | Admitting: Family Medicine

## 2017-08-22 DIAGNOSIS — Z1231 Encounter for screening mammogram for malignant neoplasm of breast: Secondary | ICD-10-CM | POA: Insufficient documentation

## 2017-09-16 DIAGNOSIS — R69 Illness, unspecified: Secondary | ICD-10-CM | POA: Diagnosis not present

## 2017-09-19 DIAGNOSIS — Z6831 Body mass index (BMI) 31.0-31.9, adult: Secondary | ICD-10-CM | POA: Diagnosis not present

## 2017-09-19 DIAGNOSIS — Z23 Encounter for immunization: Secondary | ICD-10-CM | POA: Diagnosis not present

## 2017-09-19 DIAGNOSIS — E6609 Other obesity due to excess calories: Secondary | ICD-10-CM | POA: Diagnosis not present

## 2017-09-19 DIAGNOSIS — Z0000 Encounter for general adult medical examination without abnormal findings: Secondary | ICD-10-CM | POA: Diagnosis not present

## 2017-09-19 DIAGNOSIS — E782 Mixed hyperlipidemia: Secondary | ICD-10-CM | POA: Diagnosis not present

## 2017-09-24 DIAGNOSIS — E782 Mixed hyperlipidemia: Secondary | ICD-10-CM | POA: Diagnosis not present

## 2017-09-24 DIAGNOSIS — Z681 Body mass index (BMI) 19 or less, adult: Secondary | ICD-10-CM | POA: Diagnosis not present

## 2017-10-22 DIAGNOSIS — 419620001 Death: Secondary | SNOMED CT | POA: Diagnosis not present

## 2017-10-22 DEATH — deceased

## 2017-12-09 DIAGNOSIS — Z803 Family history of malignant neoplasm of breast: Secondary | ICD-10-CM | POA: Diagnosis not present

## 2017-12-09 DIAGNOSIS — Z6831 Body mass index (BMI) 31.0-31.9, adult: Secondary | ICD-10-CM | POA: Diagnosis not present

## 2017-12-09 DIAGNOSIS — R030 Elevated blood-pressure reading, without diagnosis of hypertension: Secondary | ICD-10-CM | POA: Diagnosis not present

## 2017-12-09 DIAGNOSIS — Z8249 Family history of ischemic heart disease and other diseases of the circulatory system: Secondary | ICD-10-CM | POA: Diagnosis not present

## 2017-12-09 DIAGNOSIS — E669 Obesity, unspecified: Secondary | ICD-10-CM | POA: Diagnosis not present

## 2017-12-09 DIAGNOSIS — Z809 Family history of malignant neoplasm, unspecified: Secondary | ICD-10-CM | POA: Diagnosis not present

## 2017-12-24 DIAGNOSIS — E6609 Other obesity due to excess calories: Secondary | ICD-10-CM | POA: Diagnosis not present

## 2017-12-24 DIAGNOSIS — Z6831 Body mass index (BMI) 31.0-31.9, adult: Secondary | ICD-10-CM | POA: Diagnosis not present

## 2017-12-24 DIAGNOSIS — J209 Acute bronchitis, unspecified: Secondary | ICD-10-CM | POA: Diagnosis not present

## 2018-03-24 DIAGNOSIS — R69 Illness, unspecified: Secondary | ICD-10-CM | POA: Diagnosis not present

## 2018-07-29 ENCOUNTER — Other Ambulatory Visit (HOSPITAL_COMMUNITY): Payer: Self-pay | Admitting: Family Medicine

## 2018-07-29 DIAGNOSIS — Z1231 Encounter for screening mammogram for malignant neoplasm of breast: Secondary | ICD-10-CM

## 2018-08-01 DIAGNOSIS — Z23 Encounter for immunization: Secondary | ICD-10-CM | POA: Diagnosis not present

## 2018-08-25 ENCOUNTER — Ambulatory Visit (HOSPITAL_COMMUNITY)
Admission: RE | Admit: 2018-08-25 | Discharge: 2018-08-25 | Disposition: A | Discharge status: Home / Self Care | Payer: Medicare HMO | Source: Ambulatory Visit | Attending: Family Medicine | Admitting: Family Medicine

## 2018-08-25 DIAGNOSIS — Z1231 Encounter for screening mammogram for malignant neoplasm of breast: Secondary | ICD-10-CM | POA: Diagnosis not present

## 2018-09-12 DIAGNOSIS — H40032 Anatomical narrow angle, left eye: Secondary | ICD-10-CM | POA: Diagnosis not present

## 2018-09-12 DIAGNOSIS — H2513 Age-related nuclear cataract, bilateral: Secondary | ICD-10-CM | POA: Diagnosis not present

## 2018-09-25 DIAGNOSIS — K219 Gastro-esophageal reflux disease without esophagitis: Secondary | ICD-10-CM | POA: Diagnosis not present

## 2018-09-25 DIAGNOSIS — K5790 Diverticulosis of intestine, part unspecified, without perforation or abscess without bleeding: Secondary | ICD-10-CM | POA: Diagnosis not present

## 2018-09-25 DIAGNOSIS — Z853 Personal history of malignant neoplasm of breast: Secondary | ICD-10-CM | POA: Diagnosis not present

## 2018-09-25 DIAGNOSIS — Z6833 Body mass index (BMI) 33.0-33.9, adult: Secondary | ICD-10-CM | POA: Diagnosis not present

## 2018-09-25 DIAGNOSIS — E6609 Other obesity due to excess calories: Secondary | ICD-10-CM | POA: Diagnosis not present

## 2018-09-25 DIAGNOSIS — Z0001 Encounter for general adult medical examination with abnormal findings: Secondary | ICD-10-CM | POA: Diagnosis not present

## 2018-09-25 DIAGNOSIS — E7849 Other hyperlipidemia: Secondary | ICD-10-CM | POA: Diagnosis not present

## 2018-09-25 DIAGNOSIS — E782 Mixed hyperlipidemia: Secondary | ICD-10-CM | POA: Diagnosis not present

## 2018-09-25 DIAGNOSIS — Z1389 Encounter for screening for other disorder: Secondary | ICD-10-CM | POA: Diagnosis not present

## 2018-09-26 DIAGNOSIS — H40033 Anatomical narrow angle, bilateral: Secondary | ICD-10-CM | POA: Diagnosis not present

## 2018-09-26 DIAGNOSIS — H2513 Age-related nuclear cataract, bilateral: Secondary | ICD-10-CM | POA: Diagnosis not present

## 2018-09-30 DIAGNOSIS — R69 Illness, unspecified: Secondary | ICD-10-CM | POA: Diagnosis not present

## 2019-01-02 DIAGNOSIS — H2513 Age-related nuclear cataract, bilateral: Secondary | ICD-10-CM | POA: Diagnosis not present

## 2019-01-02 DIAGNOSIS — H04123 Dry eye syndrome of bilateral lacrimal glands: Secondary | ICD-10-CM | POA: Diagnosis not present

## 2019-01-02 DIAGNOSIS — H40033 Anatomical narrow angle, bilateral: Secondary | ICD-10-CM | POA: Diagnosis not present

## 2019-04-07 DIAGNOSIS — R69 Illness, unspecified: Secondary | ICD-10-CM | POA: Diagnosis not present

## 2019-04-13 ENCOUNTER — Other Ambulatory Visit: Payer: Medicare HMO

## 2019-04-13 ENCOUNTER — Other Ambulatory Visit: Payer: Self-pay

## 2019-04-13 DIAGNOSIS — R6889 Other general symptoms and signs: Secondary | ICD-10-CM | POA: Diagnosis not present

## 2019-04-13 DIAGNOSIS — Z20822 Contact with and (suspected) exposure to covid-19: Secondary | ICD-10-CM

## 2019-04-16 LAB — NOVEL CORONAVIRUS, NAA: SARS-CoV-2, NAA: NOT DETECTED

## 2019-06-25 DIAGNOSIS — Z23 Encounter for immunization: Secondary | ICD-10-CM | POA: Diagnosis not present

## 2019-08-27 ENCOUNTER — Other Ambulatory Visit (HOSPITAL_COMMUNITY): Payer: Self-pay | Admitting: Family Medicine

## 2019-08-27 DIAGNOSIS — Z1231 Encounter for screening mammogram for malignant neoplasm of breast: Secondary | ICD-10-CM

## 2019-09-08 DIAGNOSIS — Z853 Personal history of malignant neoplasm of breast: Secondary | ICD-10-CM | POA: Diagnosis not present

## 2019-09-08 DIAGNOSIS — Z8249 Family history of ischemic heart disease and other diseases of the circulatory system: Secondary | ICD-10-CM | POA: Diagnosis not present

## 2019-09-08 DIAGNOSIS — Z809 Family history of malignant neoplasm, unspecified: Secondary | ICD-10-CM | POA: Diagnosis not present

## 2019-09-21 ENCOUNTER — Other Ambulatory Visit: Payer: Self-pay

## 2019-09-21 ENCOUNTER — Ambulatory Visit (HOSPITAL_COMMUNITY)
Admission: RE | Admit: 2019-09-21 | Discharge: 2019-09-21 | Disposition: A | Discharge status: Home / Self Care | Payer: Medicare HMO | Source: Ambulatory Visit | Attending: Family Medicine | Admitting: Family Medicine

## 2019-09-21 DIAGNOSIS — Z1231 Encounter for screening mammogram for malignant neoplasm of breast: Secondary | ICD-10-CM | POA: Diagnosis not present

## 2019-10-09 DIAGNOSIS — Z1389 Encounter for screening for other disorder: Secondary | ICD-10-CM | POA: Diagnosis not present

## 2019-10-09 DIAGNOSIS — K219 Gastro-esophageal reflux disease without esophagitis: Secondary | ICD-10-CM | POA: Diagnosis not present

## 2019-10-09 DIAGNOSIS — E6609 Other obesity due to excess calories: Secondary | ICD-10-CM | POA: Diagnosis not present

## 2019-10-09 DIAGNOSIS — K5790 Diverticulosis of intestine, part unspecified, without perforation or abscess without bleeding: Secondary | ICD-10-CM | POA: Diagnosis not present

## 2019-10-09 DIAGNOSIS — Z0000 Encounter for general adult medical examination without abnormal findings: Secondary | ICD-10-CM | POA: Diagnosis not present

## 2019-10-09 DIAGNOSIS — Z6830 Body mass index (BMI) 30.0-30.9, adult: Secondary | ICD-10-CM | POA: Diagnosis not present

## 2019-10-13 DIAGNOSIS — R69 Illness, unspecified: Secondary | ICD-10-CM | POA: Diagnosis not present

## 2019-12-05 ENCOUNTER — Ambulatory Visit: Payer: Medicare HMO

## 2019-12-31 DIAGNOSIS — Z9889 Other specified postprocedural states: Secondary | ICD-10-CM | POA: Diagnosis not present

## 2019-12-31 DIAGNOSIS — H04123 Dry eye syndrome of bilateral lacrimal glands: Secondary | ICD-10-CM | POA: Diagnosis not present

## 2019-12-31 DIAGNOSIS — H40033 Anatomical narrow angle, bilateral: Secondary | ICD-10-CM | POA: Diagnosis not present

## 2019-12-31 DIAGNOSIS — H2513 Age-related nuclear cataract, bilateral: Secondary | ICD-10-CM | POA: Diagnosis not present

## 2020-02-11 DIAGNOSIS — H40033 Anatomical narrow angle, bilateral: Secondary | ICD-10-CM | POA: Diagnosis not present

## 2020-02-11 DIAGNOSIS — Z9889 Other specified postprocedural states: Secondary | ICD-10-CM | POA: Diagnosis not present

## 2020-04-12 DIAGNOSIS — Z6831 Body mass index (BMI) 31.0-31.9, adult: Secondary | ICD-10-CM | POA: Diagnosis not present

## 2020-04-12 DIAGNOSIS — Z803 Family history of malignant neoplasm of breast: Secondary | ICD-10-CM | POA: Diagnosis not present

## 2020-04-12 DIAGNOSIS — E669 Obesity, unspecified: Secondary | ICD-10-CM | POA: Diagnosis not present

## 2020-04-12 DIAGNOSIS — Z8249 Family history of ischemic heart disease and other diseases of the circulatory system: Secondary | ICD-10-CM | POA: Diagnosis not present

## 2020-04-12 DIAGNOSIS — Z853 Personal history of malignant neoplasm of breast: Secondary | ICD-10-CM | POA: Diagnosis not present

## 2020-04-14 DIAGNOSIS — R69 Illness, unspecified: Secondary | ICD-10-CM | POA: Diagnosis not present

## 2020-07-29 DIAGNOSIS — Z23 Encounter for immunization: Secondary | ICD-10-CM | POA: Diagnosis not present

## 2020-08-09 DIAGNOSIS — Z9889 Other specified postprocedural states: Secondary | ICD-10-CM | POA: Diagnosis not present

## 2020-08-09 DIAGNOSIS — H40033 Anatomical narrow angle, bilateral: Secondary | ICD-10-CM | POA: Diagnosis not present

## 2020-08-12 IMAGING — MG DIGITAL SCREENING BILATERAL MAMMOGRAM WITH TOMO AND CAD
8 series · 9 of 24 positions shown · non-contrast
Comparison: Previous exam(s).

CLINICAL DATA: Screening.

EXAM:
DIGITAL SCREENING BILATERAL MAMMOGRAM WITH TOMO AND CAD

[R MLO synth-2D]
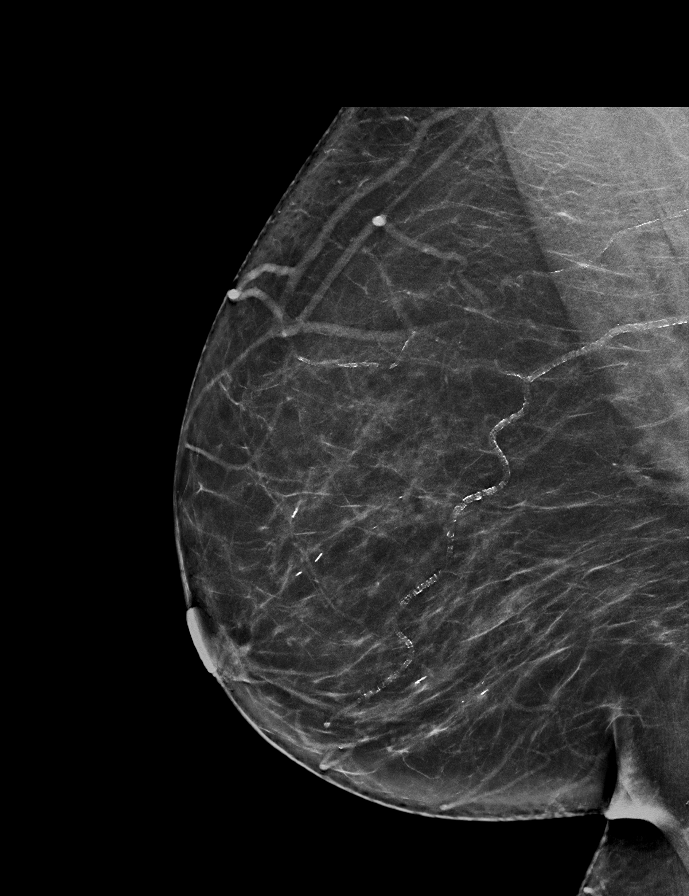

[L MLO synth-2D]
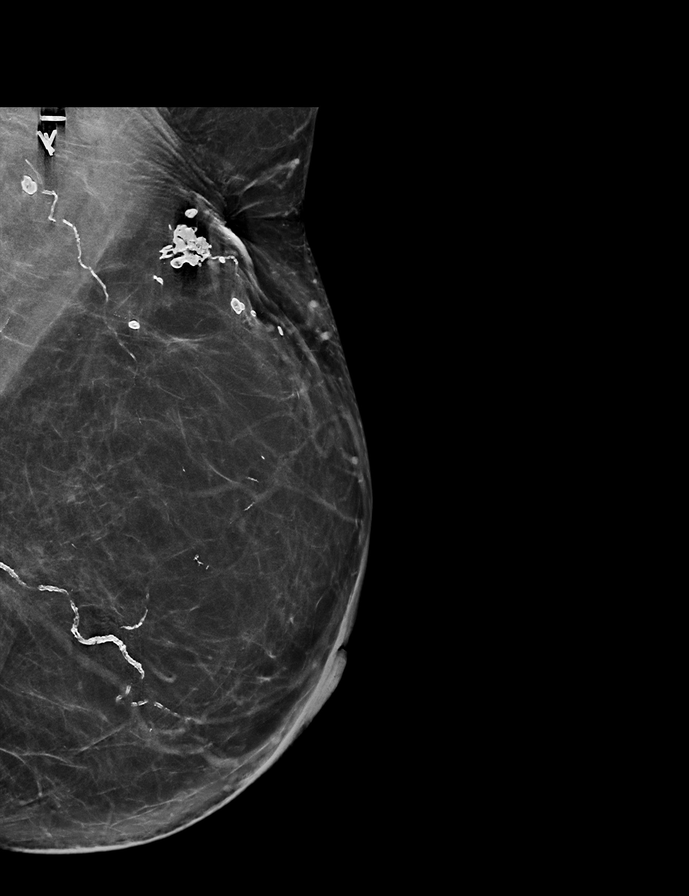

[R CC synth-2D]
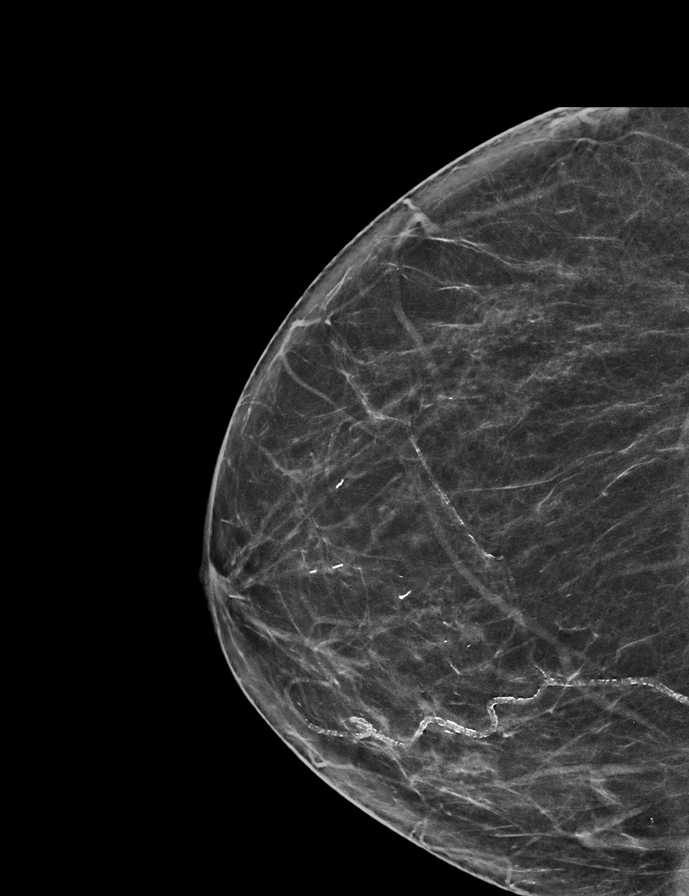

[L CC synth-2D]
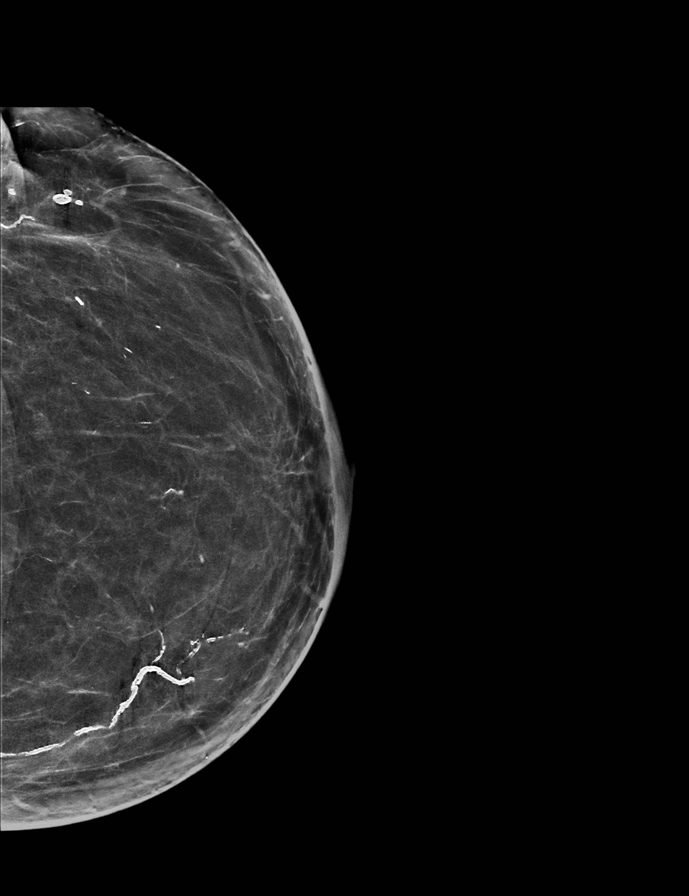

[L CC tomo · 2 of 64 frames shown]
[frame 21/64]
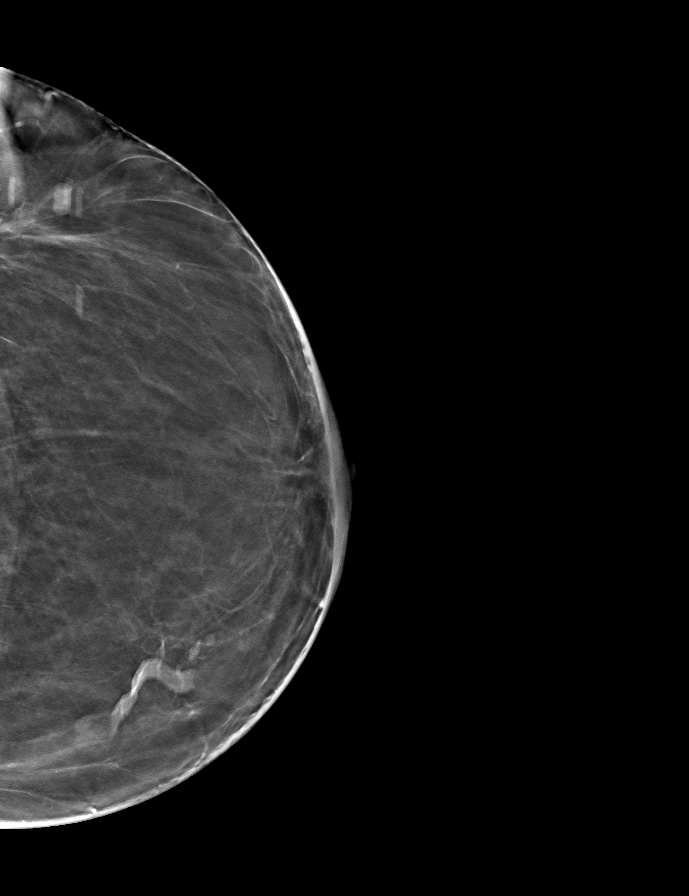
[frame 33/64]
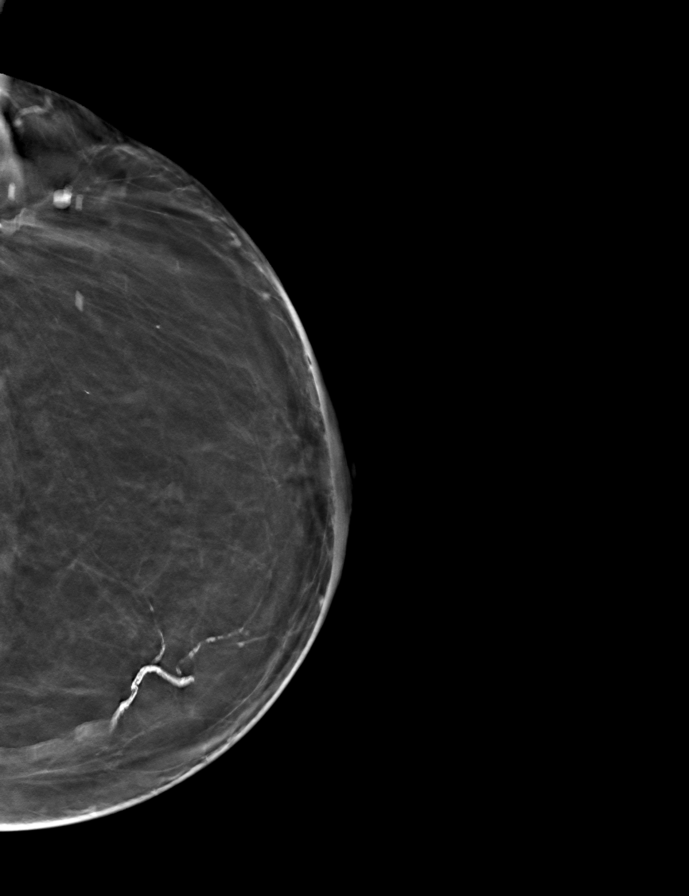

[R MLO tomo · tomo slice 38/75.0]
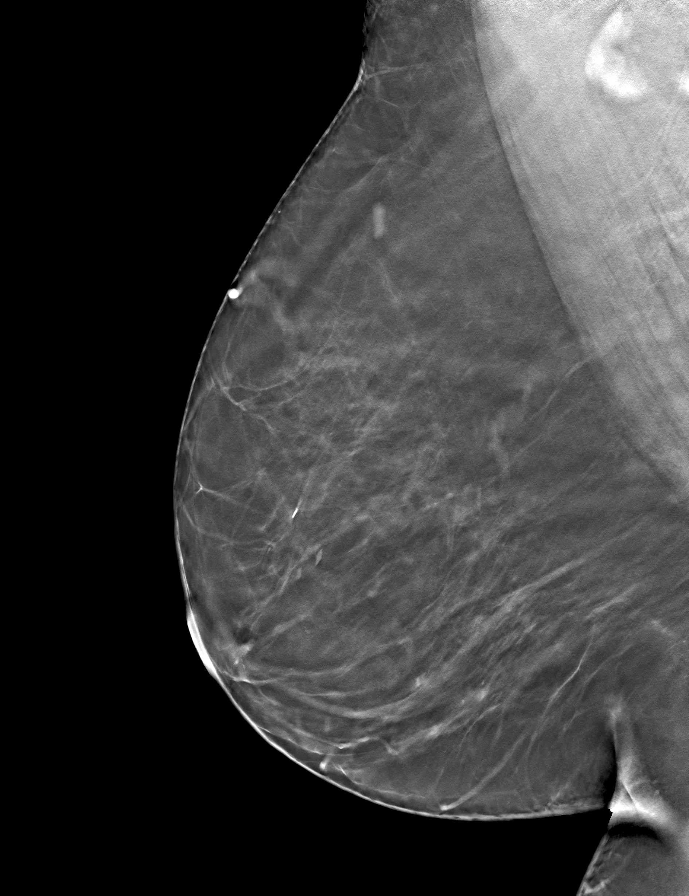

[R CC tomo · tomo slice 33/65.0]
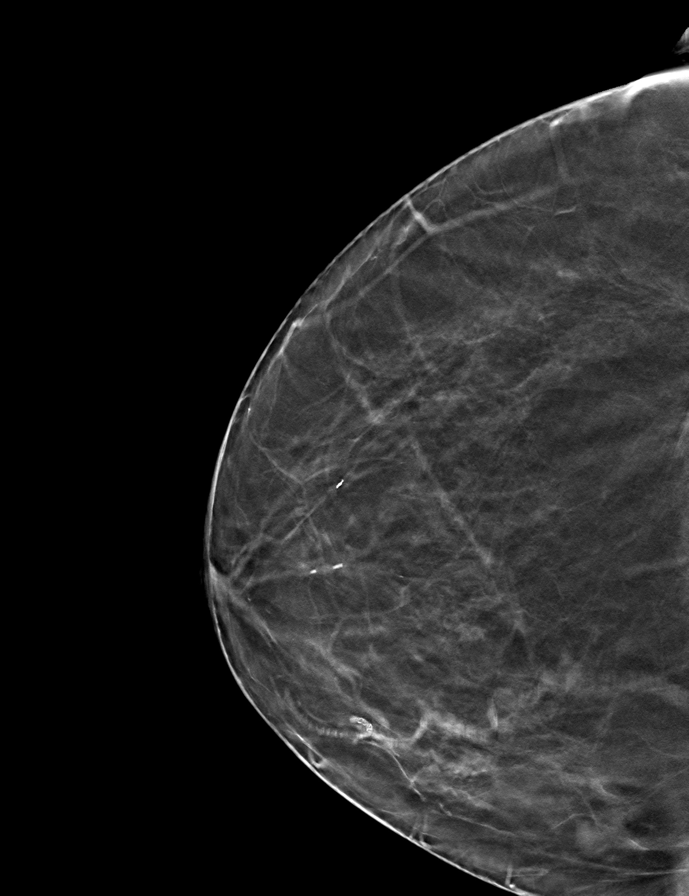

[L MLO tomo · tomo slice 37/73.0]
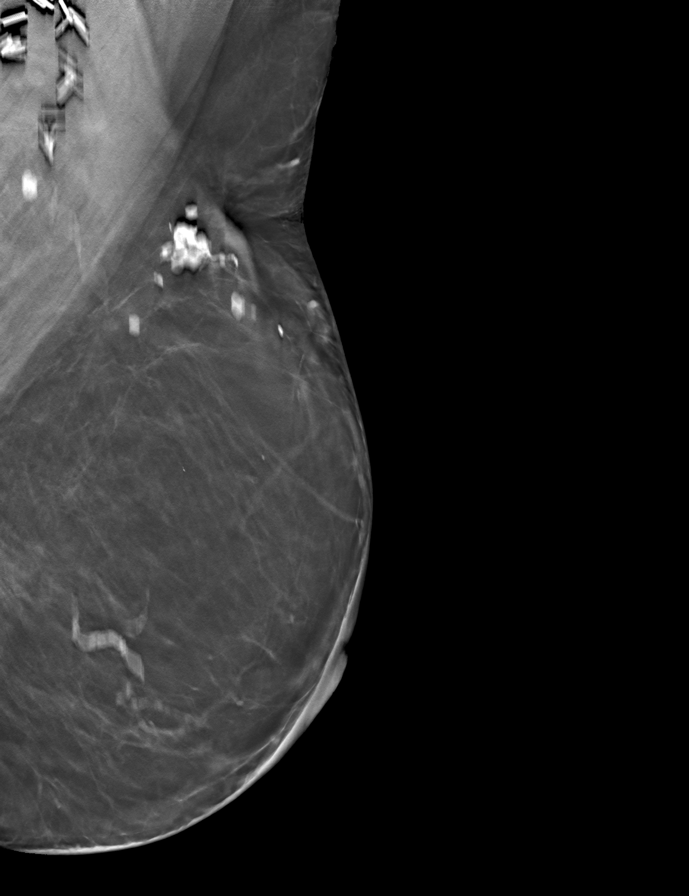

[9 of 24 positions shown; findings below may reference images not displayed]

ACR Breast Density Category b: There are scattered areas of
fibroglandular density.
FINDINGS: There are no findings suspicious for malignancy. Stable post
lumpectomy changes on the left. Images were processed with CAD.
IMPRESSION: No mammographic evidence of malignancy. A result letter of this
screening mammogram will be mailed directly to the patient.

RECOMMENDATION:
Screening mammogram in one year. (Code:5S-D-1IV)

BI-RADS CATEGORY  2: Benign.

## 2020-09-05 ENCOUNTER — Other Ambulatory Visit (HOSPITAL_COMMUNITY): Payer: Self-pay | Admitting: Family Medicine

## 2020-09-05 DIAGNOSIS — Z1231 Encounter for screening mammogram for malignant neoplasm of breast: Secondary | ICD-10-CM

## 2020-09-28 ENCOUNTER — Ambulatory Visit (HOSPITAL_COMMUNITY): Payer: Medicare HMO

## 2020-10-13 DIAGNOSIS — E782 Mixed hyperlipidemia: Secondary | ICD-10-CM | POA: Diagnosis not present

## 2020-10-13 DIAGNOSIS — K219 Gastro-esophageal reflux disease without esophagitis: Secondary | ICD-10-CM | POA: Diagnosis not present

## 2020-10-13 DIAGNOSIS — Z6830 Body mass index (BMI) 30.0-30.9, adult: Secondary | ICD-10-CM | POA: Diagnosis not present

## 2020-10-13 DIAGNOSIS — Z0000 Encounter for general adult medical examination without abnormal findings: Secondary | ICD-10-CM | POA: Diagnosis not present

## 2020-10-13 DIAGNOSIS — Z853 Personal history of malignant neoplasm of breast: Secondary | ICD-10-CM | POA: Diagnosis not present

## 2020-10-13 DIAGNOSIS — Z1389 Encounter for screening for other disorder: Secondary | ICD-10-CM | POA: Diagnosis not present

## 2020-10-13 DIAGNOSIS — E7849 Other hyperlipidemia: Secondary | ICD-10-CM | POA: Diagnosis not present

## 2020-11-14 ENCOUNTER — Other Ambulatory Visit: Payer: Self-pay

## 2020-11-14 ENCOUNTER — Ambulatory Visit (HOSPITAL_COMMUNITY)
Admission: RE | Admit: 2020-11-14 | Discharge: 2020-11-14 | Disposition: A | Discharge status: Home / Self Care | Payer: Medicare HMO | Source: Ambulatory Visit | Attending: Family Medicine | Admitting: Family Medicine

## 2020-11-14 DIAGNOSIS — Z1231 Encounter for screening mammogram for malignant neoplasm of breast: Secondary | ICD-10-CM | POA: Insufficient documentation

## 2021-01-10 DIAGNOSIS — Z9889 Other specified postprocedural states: Secondary | ICD-10-CM | POA: Diagnosis not present

## 2021-01-10 DIAGNOSIS — H2513 Age-related nuclear cataract, bilateral: Secondary | ICD-10-CM | POA: Diagnosis not present

## 2021-01-10 DIAGNOSIS — H04123 Dry eye syndrome of bilateral lacrimal glands: Secondary | ICD-10-CM | POA: Diagnosis not present

## 2021-08-02 ENCOUNTER — Other Ambulatory Visit: Payer: Self-pay

## 2021-08-02 ENCOUNTER — Ambulatory Visit
Admission: EM | Admit: 2021-08-02 | Discharge: 2021-08-02 | Disposition: A | Discharge status: Home / Self Care | Payer: Medicare HMO | Attending: Family Medicine | Admitting: Family Medicine

## 2021-08-02 DIAGNOSIS — R42 Dizziness and giddiness: Secondary | ICD-10-CM

## 2021-08-02 DIAGNOSIS — I160 Hypertensive urgency: Secondary | ICD-10-CM

## 2021-08-02 LAB — POCT URINALYSIS DIP (MANUAL ENTRY)
Bilirubin, UA: NEGATIVE
Glucose, UA: NEGATIVE mg/dL
Ketones, POC UA: NEGATIVE mg/dL
Leukocytes, UA: NEGATIVE
Nitrite, UA: NEGATIVE
Protein Ur, POC: NEGATIVE mg/dL
Spec Grav, UA: 1.01 (ref 1.010–1.025)
Urobilinogen, UA: 0.2 E.U./dL
pH, UA: 6.5 (ref 5.0–8.0)

## 2021-08-02 LAB — POCT FASTING CBG KUC MANUAL ENTRY: POCT Glucose (KUC): 110 mg/dL — AB (ref 70–99)

## 2021-08-02 NOTE — ED Triage Notes (Signed)
Onset this morning of dizziness and feeling light headed that started when patient got out of bed.  No meds taken. No food eaten yet, notes she's only had water. Denies shakiness, CP and sob.

## 2021-08-02 NOTE — Discharge Instructions (Signed)
We did not find any obvious causes of your dizziness today and we will let you know as soon as we get your blood work back.  Your blood pressure was significantly elevated today, please follow-up as soon as possible with your primary care provider on this to make sure it is calm down.  Go to the emergency department if your symptoms worsen at any time.

## 2021-08-02 NOTE — ED Provider Notes (Signed)
[facility]    CSN: [id number] Arrival date & time: 08/02/21  5366      History   Chief Complaint Chief Complaint  Patient presents with   Dizziness    HPI Katherine Mcmillan is a 75 y.o. female.   Patient presenting today with mild dizziness, feeling off balance that started this morning upon waking and getting out of bed.  She states that it is intermittent and does not cause falling, tunnel vision, headaches, syncope.  She is not having any fevers, chills, chest pain, recent head injury, nausea, vomiting, bowel changes.  She states movement tends to make it a little bit worse and then it resolves completely at rest.  She notes that she has not drink as much water as usual this morning or last night.  Her blood pressures were noted to be significantly elevated in triage today, she states she is never had an issue with her blood pressures in the past.  She is also never had a history of heart disease or stroke that she is aware of.   Past Medical History:  Diagnosis Date   Breast cancer ([place])    Infiltrating ductal carcinoma of left breast ([place]) 07/18/2013    Patient Active Problem List   Diagnosis Date Noted   Infiltrating ductal carcinoma of left breast ([place]) 07/18/2013    Past Surgical History:  Procedure Laterality Date   COLONOSCOPY N/A 08/16/2017   Procedure: COLONOSCOPY;  Surgeon: Daneil Dolin, MD;  [facility];  Service: Endoscopy;  Laterality: N/A;  11:30 AM   MASTECTOMY W/ NODES PARTIAL  11/04   left   MASTECTOMY, PARTIAL  11/04   left   POLYPECTOMY  08/16/2017   Procedure: POLYPECTOMY;  Surgeon: Daneil Dolin, MD;  Location[facility];  Service: Endoscopy;;  sigmoid, ascending   TONSILLECTOMY     TUBAL LIGATION      OB History   No obstetric history on file.      Home Medications    Prior to Admission medications   Medication Sig Start Date End Date Taking? Authorizing Provider  aspirin EC 81 MG tablet Take 81 mg by mouth  every 30 (thirty) days.    [provider]  CALCIUM PO Take 500 mg by mouth 2 (two) times daily.    [provider]  Carboxymethylcellulose Sodium (THERATEARS OP) Apply 1 drop to eye daily as needed (dry eyes).    [provider]  Fish Oil-Cholecalciferol (OMEGA-3 + D PO) Take 1 capsule by mouth daily.    [provider]  Multiple Vitamins-Minerals (CENTRUM SILVER PO) Take 1 tablet by mouth daily.     [provider]  Na Sulfate-K Sulfate-Mg Sulf (SUPREP BOWEL PREP KIT) 17.5-3.13-1.6 GM/177ML SOLN Take 1 kit by mouth as directed. 08/05/17   Mahala Menghini, PA-C  vitamin C (ASCORBIC ACID) 500 MG tablet Take 500 mg by mouth daily.    [provider]    Family History Family History  Problem Relation Age of Onset   Cancer Father        lung   Cancer Maternal Grandmother     Social History Social History   Tobacco Use   Smoking status: Never   Smokeless tobacco: Never  Vaping Use   Vaping Use: Never used  Substance Use Topics   Alcohol use: No   Drug use: No     Allergies   Patient has no known allergies.   Review of Systems  Review of Systems Per HPI  Physical Exam Triage Vital Signs ED Triage Vitals  Enc Vitals Group     BP 08/02/21 1110 (!) 207/75     Pulse Rate 08/02/21 1110 81     Resp 08/02/21 1110 18     Temp 08/02/21 1110 97.6 F (36.4 C)     Temp Source 08/02/21 1110 Oral     SpO2 08/02/21 1110 98 %     Weight --      Height --      Head Circumference --      Peak Flow --      Pain Score 08/02/21 1112 0     Pain Loc --      Pain Edu? --      Excl. in [place]? --    Orthostatic VS for the past 24 hrs:  BP- Lying Pulse- Lying BP- Sitting Pulse- Sitting BP- Standing at 0 minutes Pulse- Standing at 0 minutes  08/02/21 1144 197/78 94 (!) 195/95 95 (!) 193/94 97    Updated Vital Signs BP (!) 207/75 (BP Location: Right Arm) Comment: provider notified  Pulse 81   Temp 97.6 F (36.4 C) (Oral)   Resp 18    SpO2 98%   Visual Acuity Right Eye Distance:   Left Eye Distance:   Bilateral Distance:    Right Eye Near:   Left Eye Near:    Bilateral Near:     Physical Exam Vitals and nursing note reviewed.  Constitutional:      Appearance: Normal appearance. She is not ill-appearing.  HENT:     Head: Atraumatic.     Right Ear: Tympanic membrane normal.     Left Ear: Tympanic membrane normal.     Mouth/Throat:     Mouth: Mucous membranes are moist.  Eyes:     Extraocular Movements: Extraocular movements intact.     Conjunctiva/sclera: Conjunctivae normal.  Cardiovascular:     Rate and Rhythm: Normal rate and regular rhythm.     Heart sounds: Normal heart sounds.  Pulmonary:     Effort: Pulmonary effort is normal.     Breath sounds: Normal breath sounds.  Abdominal:     General: Bowel sounds are normal. There is no distension.     Palpations: Abdomen is soft.     Tenderness: There is no abdominal tenderness. There is no guarding.  Musculoskeletal:        General: No swelling or tenderness. Normal range of motion.     Cervical back: Normal range of motion and neck supple.  Skin:    General: Skin is warm and dry.     Findings: No erythema or rash.  Neurological:     General: No focal deficit present.     Mental Status: She is alert and oriented to person, place, and time.     Cranial Nerves: No cranial nerve deficit.     Sensory: No sensory deficit.     Motor: No weakness.     Gait: Gait normal.  Psychiatric:        Mood and Affect: Mood normal.        Thought Content: Thought content normal.        Judgment: Judgment normal.   UC Treatments / Results  Labs (all labs ordered are listed, but only abnormal results are displayed) Labs Reviewed  POCT FASTING CBG [facility] MANUAL ENTRY - Abnormal; Notable for the following components:      Result Value   POCT Glucose ([place])  110 (*)    All other components within normal limits  POCT URINALYSIS DIP (MANUAL ENTRY) - Abnormal; Notable  for the following components:   Blood, UA trace-intact (*)    All other components within normal limits  CBC WITH DIFFERENTIAL/PLATELET  COMPREHENSIVE METABOLIC PANEL    EKG   Radiology No results found.  Procedures Procedures (including critical care time)  Medications Ordered in UC Medications - No data to display  Initial Impression / Assessment and Plan / UC Course  I have reviewed the triage vital signs and the nursing notes.  Pertinent labs & imaging results that were available during my care of the patient were reviewed by me and considered in my medical decision making (see chart for details).     Significantly hypertensive throughout visit between 190s to low 200s over 70s.  Otherwise vital signs and exam very reassuring with no focal neurologic deficits noted.  EKG today showing normal sinus rhythm at 95 bpm with nonspecific ST abnormality without significant elevation.  No past EKG to compare to.  Orthostatic vital signs without drastic change, urinalysis without evidence of urinary tract infection, point-of-care glucose testing at 110.  Labs pending.  Reassurance given today but discussed that we were unable to rule out all dangerous causes of dizziness in the setting and that she should go to the emergency department if worsening in any time or if blood pressures not improving over the course of the next day or so.  She declined to go to the emergency department and wishes to go home and rest.  Follow-up with primary care provider regarding her blood pressure and dizziness in the next few days.  Reiterated to go to the emergency department if worsening at any time. Final Clinical Impressions(s) / UC Diagnoses   Final diagnoses:  Dizziness  Hypertensive urgency     Discharge Instructions      We did not find any obvious causes of your dizziness today and we will let you know as soon as we get your blood work back.  Your blood pressure was significantly elevated  today, please follow-up as soon as possible with your primary care provider on this to make sure it is calm down.  Go to the emergency department if your symptoms worsen at any time.     ED Prescriptions   None    PDMP not reviewed this encounter.   Volney American, [place] 08/02/21 1458

## 2021-08-03 LAB — COMPREHENSIVE METABOLIC PANEL
ALT: 12 IU/L (ref 0–32)
AST: 19 IU/L (ref 0–40)
Albumin/Globulin Ratio: 1.9 (ref 1.2–2.2)
Albumin: 4.5 g/dL (ref 3.7–4.7)
Alkaline Phosphatase: 67 IU/L (ref 44–121)
BUN/Creatinine Ratio: 13 (ref 12–28)
BUN: 9 mg/dL (ref 8–27)
Bilirubin Total: 0.5 mg/dL (ref 0.0–1.2)
CO2: 20 mmol/L (ref 20–29)
Calcium: 9.9 mg/dL (ref 8.7–10.3)
Chloride: 101 mmol/L (ref 96–106)
Creatinine, Ser: 0.72 mg/dL (ref 0.57–1.00)
Globulin, Total: 2.4 g/dL (ref 1.5–4.5)
Glucose: 121 mg/dL — ABNORMAL HIGH (ref 70–99)
Potassium: 4.2 mmol/L (ref 3.5–5.2)
Sodium: 138 mmol/L (ref 134–144)
Total Protein: 6.9 g/dL (ref 6.0–8.5)
eGFR: 88 mL/min/{1.73_m2} (ref >59)

## 2021-08-03 LAB — CBC WITH DIFFERENTIAL/PLATELET
Basophils Absolute: 0 10*3/uL (ref 0.0–0.2)
Basos: 0 %
EOS (ABSOLUTE): 0 10*3/uL (ref 0.0–0.4)
Eos: 0 %
Hematocrit: 44.1 % (ref 34.0–46.6)
Hemoglobin: 14.7 g/dL (ref 11.1–15.9)
Immature Grans (Abs): 0 10*3/uL (ref 0.0–0.1)
Immature Granulocytes: 0 %
Lymphocytes Absolute: 1.9 10*3/uL (ref 0.7–3.1)
Lymphs: 21 %
MCH: 29 pg (ref 26.6–33.0)
MCHC: 33.3 g/dL (ref 31.5–35.7)
MCV: 87 fL (ref 79–97)
Monocytes Absolute: 0.6 10*3/uL (ref 0.1–0.9)
Monocytes: 7 %
Neutrophils Absolute: 6.3 10*3/uL (ref 1.4–7.0)
Neutrophils: 72 %
Platelets: 310 10*3/uL (ref 150–450)
RBC: 5.07 x10E6/uL (ref 3.77–5.28)
RDW: 13.1 % (ref 11.7–15.4)
WBC: 8.9 10*3/uL (ref 3.4–10.8)

## 2021-08-10 DIAGNOSIS — Z23 Encounter for immunization: Secondary | ICD-10-CM | POA: Diagnosis not present

## 2021-08-18 DIAGNOSIS — Z8249 Family history of ischemic heart disease and other diseases of the circulatory system: Secondary | ICD-10-CM | POA: Diagnosis not present

## 2021-08-18 DIAGNOSIS — R030 Elevated blood-pressure reading, without diagnosis of hypertension: Secondary | ICD-10-CM | POA: Diagnosis not present

## 2021-08-18 DIAGNOSIS — Z803 Family history of malignant neoplasm of breast: Secondary | ICD-10-CM | POA: Diagnosis not present

## 2021-08-18 DIAGNOSIS — D84822 Immunodeficiency due to external causes: Secondary | ICD-10-CM | POA: Diagnosis not present

## 2021-08-18 DIAGNOSIS — Z853 Personal history of malignant neoplasm of breast: Secondary | ICD-10-CM | POA: Diagnosis not present

## 2021-08-18 DIAGNOSIS — Y842 Radiological procedure and radiotherapy as the cause of abnormal reaction of the patient, or of later complication, without mention of misadventure at the time of the procedure: Secondary | ICD-10-CM | POA: Diagnosis not present

## 2021-09-19 ENCOUNTER — Other Ambulatory Visit (HOSPITAL_COMMUNITY): Payer: Self-pay | Admitting: Family Medicine

## 2021-09-19 DIAGNOSIS — Z1231 Encounter for screening mammogram for malignant neoplasm of breast: Secondary | ICD-10-CM

## 2021-10-20 DIAGNOSIS — E782 Mixed hyperlipidemia: Secondary | ICD-10-CM | POA: Diagnosis not present

## 2021-10-20 DIAGNOSIS — Z853 Personal history of malignant neoplasm of breast: Secondary | ICD-10-CM | POA: Diagnosis not present

## 2021-10-20 DIAGNOSIS — E6609 Other obesity due to excess calories: Secondary | ICD-10-CM | POA: Diagnosis not present

## 2021-10-20 DIAGNOSIS — K219 Gastro-esophageal reflux disease without esophagitis: Secondary | ICD-10-CM | POA: Diagnosis not present

## 2021-10-20 DIAGNOSIS — Z6830 Body mass index (BMI) 30.0-30.9, adult: Secondary | ICD-10-CM | POA: Diagnosis not present

## 2021-10-20 DIAGNOSIS — Z1331 Encounter for screening for depression: Secondary | ICD-10-CM | POA: Diagnosis not present

## 2021-10-20 DIAGNOSIS — Z0001 Encounter for general adult medical examination with abnormal findings: Secondary | ICD-10-CM | POA: Diagnosis not present

## 2021-11-17 ENCOUNTER — Ambulatory Visit (HOSPITAL_COMMUNITY)
Admission: RE | Admit: 2021-11-17 | Discharge: 2021-11-17 | Disposition: A | Discharge status: Home / Self Care | Payer: Medicare HMO | Source: Ambulatory Visit | Attending: Family Medicine | Admitting: Family Medicine

## 2021-11-17 ENCOUNTER — Other Ambulatory Visit: Payer: Self-pay

## 2021-11-17 DIAGNOSIS — Z1231 Encounter for screening mammogram for malignant neoplasm of breast: Secondary | ICD-10-CM | POA: Insufficient documentation

## 2022-01-17 DIAGNOSIS — H04123 Dry eye syndrome of bilateral lacrimal glands: Secondary | ICD-10-CM | POA: Diagnosis not present

## 2022-01-17 DIAGNOSIS — H2513 Age-related nuclear cataract, bilateral: Secondary | ICD-10-CM | POA: Diagnosis not present

## 2022-07-11 ENCOUNTER — Encounter: Payer: Self-pay | Admitting: *Deleted

## 2022-07-27 ENCOUNTER — Encounter: Payer: Self-pay | Admitting: *Deleted

## 2022-07-27 NOTE — Patient Instructions (Signed)
  Procedure: colonoscopy  Estimated body mass index is 29.35 kg/m as calculated from the following:   Height as of this encounter: '5\' 8"'$  (1.727 m).   Weight as of this encounter: 193 lb (87.5 kg).   Have you had a colonoscopy before?  08/16/17, Dr. Gala Romney  Do you have family history of colon cancer  no  Do you have a family history of polyps? no  Previous colonoscopy with polyps removed? no  Do you have a history colorectal cancer?   no  Are you diabetic?  no  Do you have a prosthetic or mechanical heart valve? no  Do you have a pacemaker/defibrillator?   no  Have you had endocarditis/atrial fibrillation?  no  Do you use supplemental oxygen/CPAP?  no  Have you had joint replacement within the last 12 months?  no  Do you tend to be constipated or have to use laxatives?  no   Do you have history of alcohol use? If yes, how much and how often.  no  Do you have history or are you using drugs? If yes, what do are you  using?  no  Have you ever had a stroke/heart attack?  no  Have you ever had a heart or other vascular stent placed,?no  Do you take weight loss medication? no  female patients,: have you had a hysterectomy? no                              are you post menopausal?  no                              do you still have your menstrual cycle? no    Date of last menstrual period.   Do you take any blood-thinning medications such as: (Plavix, aspirin, Coumadin, Aggrenox, Brilinta, Xarelto, Eliquis, Pradaxa, Savaysa or Effient) no  If yes we need the name, milligram, dosage and who is prescribing doctor:               Current Outpatient Medications  Medication Sig Dispense Refill   Fish Oil-Cholecalciferol (OMEGA-3 + D PO) Take 1 capsule by mouth daily.     Multiple Vitamins-Minerals (CENTRUM SILVER PO) Take 1 tablet by mouth daily.      vitamin B-12 (CYANOCOBALAMIN) 500 MCG tablet Take 500 mcg by mouth daily.     vitamin C (ASCORBIC ACID) 500 MG tablet Take 500  mg by mouth daily.     No current facility-administered medications for this visit.    No Known Allergies

## 2022-08-07 ENCOUNTER — Encounter: Payer: Self-pay | Admitting: *Deleted

## 2022-08-07 NOTE — Progress Notes (Signed)
ASA 2, okay to schedule. 

## 2022-08-07 NOTE — Progress Notes (Signed)
Spoke with pt. She has been scheduled for 11/16 at 2:15pm. She is going to come by office to pick up instructions/prep sample.

## 2022-08-09 DIAGNOSIS — Z23 Encounter for immunization: Secondary | ICD-10-CM | POA: Diagnosis not present

## 2022-08-10 DIAGNOSIS — Z6829 Body mass index (BMI) 29.0-29.9, adult: Secondary | ICD-10-CM | POA: Diagnosis not present

## 2022-08-10 DIAGNOSIS — E669 Obesity, unspecified: Secondary | ICD-10-CM | POA: Diagnosis not present

## 2022-08-10 DIAGNOSIS — Z853 Personal history of malignant neoplasm of breast: Secondary | ICD-10-CM | POA: Diagnosis not present

## 2022-09-06 ENCOUNTER — Encounter (HOSPITAL_COMMUNITY): Payer: Self-pay | Admitting: Internal Medicine

## 2022-09-06 ENCOUNTER — Other Ambulatory Visit: Payer: Self-pay

## 2022-09-06 ENCOUNTER — Ambulatory Visit (HOSPITAL_COMMUNITY)
Admission: RE | Admit: 2022-09-06 | Discharge: 2022-09-06 | Disposition: A | Discharge status: Home / Self Care | Payer: Medicare HMO | Attending: Internal Medicine | Admitting: Internal Medicine

## 2022-09-06 ENCOUNTER — Ambulatory Visit (HOSPITAL_BASED_OUTPATIENT_CLINIC_OR_DEPARTMENT_OTHER): Payer: Medicare HMO | Admitting: Certified Registered Nurse Anesthetist

## 2022-09-06 ENCOUNTER — Ambulatory Visit (HOSPITAL_COMMUNITY): Payer: Medicare HMO | Admitting: Certified Registered Nurse Anesthetist

## 2022-09-06 ENCOUNTER — Encounter (HOSPITAL_COMMUNITY)
Admission: RE | Disposition: A | Discharge status: Home / Self Care | Payer: Self-pay | Source: Home / Self Care | Attending: Internal Medicine

## 2022-09-06 DIAGNOSIS — Z86010 Personal history of colonic polyps: Secondary | ICD-10-CM | POA: Diagnosis not present

## 2022-09-06 DIAGNOSIS — K5730 Diverticulosis of large intestine without perforation or abscess without bleeding: Secondary | ICD-10-CM

## 2022-09-06 DIAGNOSIS — K635 Polyp of colon: Secondary | ICD-10-CM | POA: Diagnosis not present

## 2022-09-06 DIAGNOSIS — Z1211 Encounter for screening for malignant neoplasm of colon: Secondary | ICD-10-CM | POA: Insufficient documentation

## 2022-09-06 DIAGNOSIS — D120 Benign neoplasm of cecum: Secondary | ICD-10-CM | POA: Diagnosis not present

## 2022-09-06 DIAGNOSIS — Z09 Encounter for follow-up examination after completed treatment for conditions other than malignant neoplasm: Secondary | ICD-10-CM | POA: Diagnosis not present

## 2022-09-06 HISTORY — PX: POLYPECTOMY: SHX5525

## 2022-09-06 HISTORY — PX: COLONOSCOPY WITH PROPOFOL: SHX5780

## 2022-09-06 SURGERY — COLONOSCOPY WITH PROPOFOL
Anesthesia: General

## 2022-09-06 MED ORDER — PROPOFOL 1000 MG/100ML IV EMUL
INTRAVENOUS | Status: AC
  Filled 2022-09-06: qty 400

## 2022-09-06 MED ORDER — PROPOFOL 10 MG/ML IV BOLUS
INTRAVENOUS | Status: DC | PRN
  Administered 2022-09-06: 100 mg via INTRAVENOUS
  Administered 2022-09-06: 50 mg via INTRAVENOUS

## 2022-09-06 MED ORDER — LACTATED RINGERS IV SOLN: INTRAVENOUS | Status: DC | PRN

## 2022-09-06 MED ORDER — LIDOCAINE HCL (CARDIAC) PF 100 MG/5ML IV SOSY
PREFILLED_SYRINGE | INTRAVENOUS | Status: DC | PRN
  Administered 2022-09-06: 60 mg via INTRATRACHEAL

## 2022-09-06 MED ORDER — LACTATED RINGERS IV SOLN: INTRAVENOUS | Status: DC

## 2022-09-06 MED ORDER — LIDOCAINE HCL (PF) 2 % IJ SOLN
INTRAMUSCULAR | Status: AC
  Filled 2022-09-06: qty 30

## 2022-09-06 MED ORDER — PROPOFOL 500 MG/50ML IV EMUL
INTRAVENOUS | Status: DC | PRN
  Administered 2022-09-06: 150 ug/kg/min via INTRAVENOUS

## 2022-09-06 NOTE — Anesthesia Preprocedure Evaluation (Signed)
Anesthesia Evaluation  Patient identified by MRN, date of birth, ID band Patient awake    Reviewed: Allergy & Precautions, H&P , NPO status , Patient's Chart, lab work & pertinent test results, reviewed documented beta blocker date and time   Airway Mallampati: II  TM Distance: >3 FB Neck ROM: full    Dental no notable dental hx.    Pulmonary neg pulmonary ROS   Pulmonary exam normal breath sounds clear to auscultation       Cardiovascular Exercise Tolerance: Good negative cardio ROS  Rhythm:regular Rate:Normal     Neuro/Psych negative neurological ROS  negative psych ROS   GI/Hepatic negative GI ROS, Neg liver ROS,,,  Endo/Other  negative endocrine ROS    Renal/GU negative Renal ROS  negative genitourinary   Musculoskeletal   Abdominal   Peds  Hematology negative hematology ROS (+)   Anesthesia Other Findings   Reproductive/Obstetrics negative OB ROS                              Anesthesia Physical Anesthesia Plan  ASA: 2  Anesthesia Plan: General   Post-op Pain Management:    Induction:   PONV Risk Score and Plan:   Airway Management Planned:   Additional Equipment:   Intra-op Plan:   Post-operative Plan:   Informed Consent: I have reviewed the patients History and Physical, chart, labs and discussed the procedure including the risks, benefits and alternatives for the proposed anesthesia with the patient or authorized representative who has indicated his/her understanding and acceptance.     Dental Advisory Given  Plan Discussed with: CRNA  Anesthesia Plan Comments:          Anesthesia Quick Evaluation

## 2022-09-06 NOTE — Transfer of Care (Signed)
Immediate Anesthesia Transfer of Care Note  Patient: Katherine Mcmillan  Procedure(s) Performed: COLONOSCOPY WITH PROPOFOL POLYPECTOMY  Patient Location: Short Stay  Anesthesia Type:General  Level of Consciousness: awake  Airway & Oxygen Therapy: Patient Spontanous Breathing  Post-op Assessment: Report given to RN and Post -op Vital signs reviewed and stable  Post vital signs: Reviewed and stable  Last Vitals:  Vitals Value Taken Time  BP 160/70   Temp 98   Pulse 90   Resp 16   SpO2 100     Last Pain:  Vitals:   09/06/22 1254  TempSrc:   PainSc: 0-No pain      Patients Stated Pain Goal: 8 (35/39/12 2583)  Complications: No notable events documented.

## 2022-09-06 NOTE — Discharge Instructions (Addendum)
  Colonoscopy Discharge Instructions  Read the instructions outlined below and refer to this sheet in the next few weeks. These discharge instructions provide you with general information on caring for yourself after you leave the hospital. Your doctor may also give you specific instructions. While your treatment has been planned according to the most current medical practices available, unavoidable complications occasionally occur. If you have any problems or questions after discharge, call Dr. Gala Romney at [phone]. ACTIVITY You may resume your regular activity, but move at a slower pace for the next 24 hours.  Take frequent rest periods for the next 24 hours.  Walking will help get rid of the air and reduce the bloated feeling in your belly (abdomen).  No driving for 24 hours (because of the medicine (anesthesia) used during the test).   Do not sign any important legal documents or operate any machinery for 24 hours (because of the anesthesia used during the test).  NUTRITION Drink plenty of fluids.  You may resume your normal diet as instructed by your doctor.  Begin with a light meal and progress to your normal diet. Heavy or fried foods are harder to digest and may make you feel sick to your stomach (nauseated).  Avoid alcoholic beverages for 24 hours or as instructed.  MEDICATIONS You may resume your normal medications unless your doctor tells you otherwise.  WHAT YOU CAN EXPECT TODAY Some feelings of bloating in the abdomen.  Passage of more gas than usual.  Spotting of blood in your stool or on the toilet paper.  IF YOU HAD POLYPS REMOVED DURING THE COLONOSCOPY: No aspirin products for 7 days or as instructed.  No alcohol for 7 days or as instructed.  Eat a soft diet for the next 24 hours.  FINDING OUT THE RESULTS OF YOUR TEST Not all test results are available during your visit. If your test results are not back during the visit, make an appointment with your caregiver to find out the  results. Do not assume everything is normal if you have not heard from your caregiver or the medical facility. It is important for you to follow up on all of your test results.  SEEK IMMEDIATE MEDICAL ATTENTION IF: You have more than a spotting of blood in your stool.  Your belly is swollen (abdominal distention).  You are nauseated or vomiting.  You have a temperature over 101.  You have abdominal pain or discomfort that is severe or gets worse throughout the day.    4 small polyps removed from your colon today  Colon polyp and diverticulosis information provided   further recommendations to follow pending review of pathology report   at patient request, I called Orma Render at [phone] -  rolled to voicemail.  Left a message.

## 2022-09-06 NOTE — H&P (Signed)
$'@LOGO'D$ @   Primary Care Physician:  Scherrie Bateman Primary Gastroenterologist:  Dr. Gala Romney  Pre-Procedure History & Physical: HPI:  Katherine Mcmillan is a 76 y.o. female here for surveillance colonoscopy.  History multiple colonic adenomas removed  in 2019.  No GI symptoms at this time.  Past Medical History:  Diagnosis Date   Breast cancer [facility])    Infiltrating ductal carcinoma of left breast ([place]) 07/18/2013    Past Surgical History:  Procedure Laterality Date   COLONOSCOPY N/A 08/16/2017   Procedure: COLONOSCOPY;  Surgeon: Daneil Dolin, MD;  [facility];  Service: Endoscopy;  Laterality: N/A;  11:30 AM   MASTECTOMY W/ NODES PARTIAL  11/04   left   MASTECTOMY, PARTIAL  11/04   left   POLYPECTOMY  08/16/2017   Procedure: POLYPECTOMY;  Surgeon: Daneil Dolin, MD;  [facility];  Service: Endoscopy;;  sigmoid, ascending   TONSILLECTOMY     TUBAL LIGATION      Prior to Admission medications   Medication Sig Start Date End Date Taking? Authorizing Provider  acetaminophen (TYLENOL) 500 MG tablet Take 500-1,000 mg by mouth every 6 (six) hours as needed (pain).   Yes [provider]  aspirin EC 81 MG tablet Take 81 mg by mouth once a week. Swallow whole.   Yes [provider]  Carboxymethylcellulose Sodium (THERATEARS OP) Place 1-2 drops into both eyes 3 (three) times daily as needed (dry/irritated eyes.).   Yes [provider]  Cholecalciferol (VITAMIN D-3 PO) Take 1 tablet by mouth in the morning.   Yes [provider]  Multiple Vitamin (MULTIVITAMIN WITH MINERALS) TABS tablet Take 1 tablet by mouth daily. CENTRUM FOR WOMEN 50+   Yes [provider]  Omega 3-6-9 Fatty Acids (OMEGA 3-6-9 PO) Take 1 tablet by mouth in the morning.   Yes [provider]  vitamin B-12 (CYANOCOBALAMIN) 500 MCG tablet Take 500 mcg by mouth in the morning.   Yes [provider]  vitamin C (ASCORBIC ACID) 500 MG  tablet Take 500 mg by mouth daily.   Yes [provider]    Allergies as of 08/07/2022   (No Known Allergies)    Family History  Problem Relation Age of Onset   Cancer Father        lung   Cancer Maternal Grandmother     Social History   Socioeconomic History   Marital status: Widowed    Spouse name: Not on file   Number of children: Not on file   Years of education: Not on file   Highest education level: Not on file  Occupational History   Not on file  Tobacco Use   Smoking status: Never   Smokeless tobacco: Never  Vaping Use   Vaping Use: Never used  Substance and Sexual Activity   Alcohol use: No   Drug use: No   Sexual activity: Not Currently    Birth control/protection: Surgical  Other Topics Concern   Not on file  Social History Narrative   Not on file   Social Determinants of Health   Financial Resource Strain: Not on file  Food Insecurity: Not on file  Transportation Needs: Not on file  Physical Activity: Not on file  Stress: Not on file  Social Connections: Not on file  Intimate Partner Violence: Not on file    Review of Systems: See HPI, otherwise negative ROS  Physical Exam: There were no vitals taken for this visit.  General:   Alert,  Well-developed, well-nourished, pleasant and cooperative in NAD Neck:  Supple; no masses or thyromegaly. No significant cervical adenopathy. Lungs:  Clear throughout to auscultation.   No wheezes, crackles, or rhonchi. No acute distress. Heart:  Regular rate and rhythm; no murmurs, clicks, rubs,  or gallops. Abdomen: Non-distended, normal bowel sounds.  Soft and nontender without appreciable mass or hepatosplenomegaly.  Pulses:  Normal pulses noted. Extremities:  Without clubbing or edema.  Impression/Plan:    76 year old lady with history multiple colonic adenomas removed 5 years ago.  She is here for surveillance colonoscopy per plan.  I have offered her a surveillance colonoscopy today. The risks,  benefits, limitations, alternatives and imponderables have been reviewed with the patient. Questions have been answered. All parties are agreeable.       Notice: This dictation was prepared with Dragon dictation along with smaller phrase technology. Any transcriptional errors that result from this process are unintentional and may not be corrected upon review.

## 2022-09-06 NOTE — Op Note (Signed)
[facility] Patient Name: Katherine Mcmillan Procedure Date: 09/06/2022 12:49 PM MRN: [id number] Date of Birth: 01/21/1946 Attending MD: Norvel Richards , MD, [id number] CSN: [id number] Age: 76 Admit Type: Outpatient Procedure:                Colonoscopy Indications:              High risk colon cancer surveillance: Personal                            history of colonic polyps Providers:                Norvel Richards, MD, Rosina Lowenstein, RN,                            Everardo Pacific Referring MD:              Medicines:                Propofol per Anesthesia Complications:            No immediate complications. Estimated Blood Loss:     Estimated blood loss was minimal. Procedure:                Pre-Anesthesia Assessment:                           - Prior to the procedure, a History and Physical                            was performed, and patient medications and                            allergies were reviewed. The patient's tolerance of                            previous anesthesia was also reviewed. The risks                            and benefits of the procedure and the sedation                            options and risks were discussed with the patient.                            All questions were answered, and informed consent                            was obtained. Prior Anticoagulants: The patient has                            taken no anticoagulant or antiplatelet agents. ASA                            Grade Assessment: II - A patient with mild systemic  disease. After reviewing the risks and benefits,                            the patient was deemed in satisfactory condition to                            undergo the procedure.                           After obtaining informed consent, the colonoscope                            was passed under direct vision. Throughout the                            procedure, the patient's  blood pressure, pulse, and                            oxygen saturations were monitored continuously. The                            [id number]) scope was introduced through                            the anus and advanced to the the cecum, identified                            by appendiceal orifice and ileocecal valve. The                            colonoscopy was performed without difficulty. The                            patient tolerated the procedure well. The quality                            of the bowel preparation was adequate. The entire                            colon was well visualized. The ileocecal valve,                            appendiceal orifice, and rectum were photographed. Scope In: 12:57:39 PM Scope Out: 1:14:10 PM Scope Withdrawal Time: 0 hours 11 minutes 48 seconds  Total Procedure Duration: 0 hours 16 minutes 31 seconds  Findings:      The perianal and digital rectal examinations were normal.      Scattered medium-mouthed diverticula were found in the entire colon.      Four sessile polyps were found in the cecum. The polyps were 3 to 6 mm       in size. These polyps were removed with a cold snare. Resection and       retrieval were complete. Estimated blood loss was minimal.      The exam was otherwise without abnormality on direct and retroflexion  views. Impression:               - Diverticulosis in the entire examined colon.                           - Four 3 to 6 mm polyps in the cecum, removed with                            a cold snare. Resected and retrieved.                           - The examination was otherwise normal on direct                            and retroflexion views. Moderate Sedation:      Moderate (conscious) sedation was personally administered by an       anesthesia professional. The following parameters were monitored: oxygen       saturation, heart rate, blood pressure, respiratory rate, EKG, adequacy       of  pulmonary ventilation, and response to care. Recommendation:           - Patient has a contact number available for                            emergencies. The signs and symptoms of potential                            delayed complications were discussed with the                            patient. Return to normal activities tomorrow.                            Written discharge instructions were provided to the                            patient.                           - Resume previous diet.                           - Continue present medications.                           - Repeat colonoscopy date to be determined after                            pending pathology results are reviewed for                            surveillance.                           - Return to GI office (date not yet determined). Procedure Code(s):        --- Professional ---  45385, Colonoscopy, flexible; with removal of                            tumor(s), polyp(s), or other lesion(s) by snare                            technique Diagnosis Code(s):        --- Professional ---                           Z86.010, Personal history of colonic polyps                           D12.0, Benign neoplasm of cecum                           K57.30, Diverticulosis of large intestine without                            perforation or abscess without bleeding CPT copyright 2022 [employer]. All rights reserved. The codes documented in this report are preliminary and upon coder review may  be revised to meet current compliance requirements. [name]. [name], MD Norvel Richards, MD 09/06/2022 1:18:16 PM This report has been signed electronically. Number of Addenda: 0

## 2022-09-06 NOTE — Anesthesia Postprocedure Evaluation (Signed)
Anesthesia Post Note  Patient: Katherine Mcmillan  Procedure(s) Performed: COLONOSCOPY WITH PROPOFOL POLYPECTOMY  Patient location during evaluation: Phase II Anesthesia Type: General Level of consciousness: awake and alert Pain management: pain level controlled Vital Signs Assessment: post-procedure vital signs reviewed and stable Respiratory status: spontaneous breathing, nonlabored ventilation, respiratory function stable and patient connected to nasal cannula oxygen Cardiovascular status: blood pressure returned to baseline and stable Postop Assessment: no apparent nausea or vomiting Anesthetic complications: no   No notable events documented.   Last Vitals:  Vitals:   09/06/22 1242 09/06/22 1315  BP: (!) 164/72 112/62  Pulse: 89 74  Resp: 11 17  Temp: 36.6 C 36.5 C  SpO2: 100% 99%    Last Pain:  Vitals:   09/06/22 1315  TempSrc: Axillary  PainSc: 0-No pain                 Ashante Yellin Clyde Canterbury

## 2022-09-07 ENCOUNTER — Encounter: Payer: Self-pay | Admitting: Internal Medicine

## 2022-09-07 LAB — SURGICAL PATHOLOGY

## 2022-09-17 ENCOUNTER — Encounter (HOSPITAL_COMMUNITY): Payer: Self-pay | Admitting: Internal Medicine

## 2022-10-26 DIAGNOSIS — Z1331 Encounter for screening for depression: Secondary | ICD-10-CM | POA: Diagnosis not present

## 2022-10-26 DIAGNOSIS — Z0001 Encounter for general adult medical examination with abnormal findings: Secondary | ICD-10-CM | POA: Diagnosis not present

## 2022-10-26 DIAGNOSIS — E782 Mixed hyperlipidemia: Secondary | ICD-10-CM | POA: Diagnosis not present

## 2022-10-26 DIAGNOSIS — E663 Overweight: Secondary | ICD-10-CM | POA: Diagnosis not present

## 2022-10-31 ENCOUNTER — Other Ambulatory Visit (HOSPITAL_COMMUNITY): Payer: Self-pay | Admitting: Family Medicine

## 2022-10-31 DIAGNOSIS — Z1231 Encounter for screening mammogram for malignant neoplasm of breast: Secondary | ICD-10-CM

## 2022-11-19 ENCOUNTER — Ambulatory Visit (HOSPITAL_COMMUNITY)
Admission: RE | Admit: 2022-11-19 | Discharge: 2022-11-19 | Disposition: A | Discharge status: Home / Self Care | Payer: Medicare HMO | Source: Ambulatory Visit | Attending: Family Medicine | Admitting: Family Medicine

## 2022-11-19 DIAGNOSIS — Z1231 Encounter for screening mammogram for malignant neoplasm of breast: Secondary | ICD-10-CM | POA: Diagnosis not present

## 2023-02-13 DIAGNOSIS — Z9889 Other specified postprocedural states: Secondary | ICD-10-CM | POA: Diagnosis not present

## 2023-02-13 DIAGNOSIS — H2513 Age-related nuclear cataract, bilateral: Secondary | ICD-10-CM | POA: Diagnosis not present

## 2023-02-13 DIAGNOSIS — H04123 Dry eye syndrome of bilateral lacrimal glands: Secondary | ICD-10-CM | POA: Diagnosis not present

## 2023-06-06 DIAGNOSIS — Z6829 Body mass index (BMI) 29.0-29.9, adult: Secondary | ICD-10-CM | POA: Diagnosis not present

## 2023-06-06 DIAGNOSIS — E782 Mixed hyperlipidemia: Secondary | ICD-10-CM | POA: Diagnosis not present

## 2023-06-06 DIAGNOSIS — E6609 Other obesity due to excess calories: Secondary | ICD-10-CM | POA: Diagnosis not present

## 2023-06-06 DIAGNOSIS — M25532 Pain in left wrist: Secondary | ICD-10-CM | POA: Diagnosis not present

## 2023-06-25 DIAGNOSIS — Z809 Family history of malignant neoplasm, unspecified: Secondary | ICD-10-CM | POA: Diagnosis not present

## 2023-06-25 DIAGNOSIS — Z8249 Family history of ischemic heart disease and other diseases of the circulatory system: Secondary | ICD-10-CM | POA: Diagnosis not present

## 2023-06-25 DIAGNOSIS — K219 Gastro-esophageal reflux disease without esophagitis: Secondary | ICD-10-CM | POA: Diagnosis not present

## 2023-06-25 DIAGNOSIS — E785 Hyperlipidemia, unspecified: Secondary | ICD-10-CM | POA: Diagnosis not present

## 2023-06-25 DIAGNOSIS — Z853 Personal history of malignant neoplasm of breast: Secondary | ICD-10-CM | POA: Diagnosis not present

## 2023-08-02 DIAGNOSIS — Z23 Encounter for immunization: Secondary | ICD-10-CM | POA: Diagnosis not present

## 2023-11-14 ENCOUNTER — Other Ambulatory Visit (HOSPITAL_COMMUNITY): Payer: Self-pay | Admitting: Family Medicine

## 2023-11-14 DIAGNOSIS — Z1231 Encounter for screening mammogram for malignant neoplasm of breast: Secondary | ICD-10-CM

## 2023-11-25 ENCOUNTER — Encounter (HOSPITAL_COMMUNITY): Payer: Self-pay

## 2023-11-25 ENCOUNTER — Ambulatory Visit (HOSPITAL_COMMUNITY)
Admission: RE | Admit: 2023-11-25 | Discharge: 2023-11-25 | Disposition: A | Discharge status: Home / Self Care | Payer: Medicare HMO | Source: Ambulatory Visit | Attending: Family Medicine | Admitting: Family Medicine

## 2023-11-25 DIAGNOSIS — Z1231 Encounter for screening mammogram for malignant neoplasm of breast: Secondary | ICD-10-CM | POA: Insufficient documentation

## 2023-12-09 DIAGNOSIS — Z79899 Other long term (current) drug therapy: Secondary | ICD-10-CM | POA: Diagnosis not present

## 2023-12-09 DIAGNOSIS — I10 Essential (primary) hypertension: Secondary | ICD-10-CM | POA: Diagnosis not present

## 2023-12-09 DIAGNOSIS — Z0000 Encounter for general adult medical examination without abnormal findings: Secondary | ICD-10-CM | POA: Diagnosis not present

## 2023-12-09 DIAGNOSIS — R011 Cardiac murmur, unspecified: Secondary | ICD-10-CM | POA: Diagnosis not present

## 2023-12-09 DIAGNOSIS — E663 Overweight: Secondary | ICD-10-CM | POA: Diagnosis not present

## 2023-12-09 DIAGNOSIS — Z853 Personal history of malignant neoplasm of breast: Secondary | ICD-10-CM | POA: Diagnosis not present

## 2023-12-19 ENCOUNTER — Other Ambulatory Visit (HOSPITAL_COMMUNITY): Payer: Self-pay | Admitting: Family Medicine

## 2023-12-19 DIAGNOSIS — E2839 Other primary ovarian failure: Secondary | ICD-10-CM

## 2023-12-24 ENCOUNTER — Ambulatory Visit (HOSPITAL_COMMUNITY)
Admission: RE | Admit: 2023-12-24 | Discharge: 2023-12-24 | Disposition: A | Discharge status: Home / Self Care | Payer: Medicare HMO | Source: Ambulatory Visit | Attending: Family Medicine | Admitting: Family Medicine

## 2023-12-24 DIAGNOSIS — E2839 Other primary ovarian failure: Secondary | ICD-10-CM | POA: Insufficient documentation
